# Patient Record
Sex: Female | Born: 1965 | Race: White | Hispanic: No | Smoking: Former smoker
Health system: Southern US, Community
[De-identification: ages and names within clinical notes are randomized; demographics above are authoritative.]

## PROBLEM LIST (undated history)

## (undated) DIAGNOSIS — M1990 Unspecified osteoarthritis, unspecified site: Secondary | ICD-10-CM

## (undated) DIAGNOSIS — M25511 Pain in right shoulder: Secondary | ICD-10-CM

## (undated) DIAGNOSIS — L309 Dermatitis, unspecified: Secondary | ICD-10-CM

## (undated) DIAGNOSIS — M069 Rheumatoid arthritis, unspecified: Secondary | ICD-10-CM

## (undated) DIAGNOSIS — E079 Disorder of thyroid, unspecified: Secondary | ICD-10-CM

## (undated) DIAGNOSIS — M25512 Pain in left shoulder: Secondary | ICD-10-CM

## (undated) HISTORY — DX: Unspecified osteoarthritis, unspecified site: M19.90

## (undated) HISTORY — PX: CERVICAL DISC SURGERY: SHX588

## (undated) HISTORY — DX: Dermatitis, unspecified: L30.9

---

## 1999-06-25 ENCOUNTER — Encounter: Payer: Self-pay | Admitting: Neurosurgery

## 1999-06-29 ENCOUNTER — Inpatient Hospital Stay (HOSPITAL_COMMUNITY): Admission: RE | Admit: 1999-06-29 | Discharge: 1999-06-30 | Payer: Self-pay | Admitting: Neurosurgery

## 1999-06-29 ENCOUNTER — Encounter: Payer: Self-pay | Admitting: Neurosurgery

## 1999-08-30 ENCOUNTER — Ambulatory Visit (HOSPITAL_COMMUNITY): Admission: RE | Admit: 1999-08-30 | Discharge: 1999-08-30 | Payer: Self-pay | Admitting: Neurosurgery

## 1999-08-30 ENCOUNTER — Encounter: Payer: Self-pay | Admitting: Neurosurgery

## 2001-05-23 ENCOUNTER — Ambulatory Visit (HOSPITAL_COMMUNITY): Admission: RE | Admit: 2001-05-23 | Discharge: 2001-05-23 | Payer: Self-pay | Admitting: *Deleted

## 2001-05-23 ENCOUNTER — Encounter: Payer: Self-pay | Admitting: *Deleted

## 2001-05-30 ENCOUNTER — Ambulatory Visit (HOSPITAL_COMMUNITY): Admission: RE | Admit: 2001-05-30 | Discharge: 2001-05-30 | Payer: Self-pay | Admitting: *Deleted

## 2001-05-30 ENCOUNTER — Encounter: Payer: Self-pay | Admitting: *Deleted

## 2002-12-23 ENCOUNTER — Ambulatory Visit (HOSPITAL_COMMUNITY): Admission: RE | Admit: 2002-12-23 | Discharge: 2002-12-23 | Payer: Self-pay | Admitting: Family Medicine

## 2002-12-23 ENCOUNTER — Encounter: Payer: Self-pay | Admitting: Family Medicine

## 2004-02-05 ENCOUNTER — Ambulatory Visit (HOSPITAL_COMMUNITY): Admission: RE | Admit: 2004-02-05 | Discharge: 2004-02-05 | Payer: Self-pay | Admitting: Family Medicine

## 2005-07-12 ENCOUNTER — Ambulatory Visit: Payer: Self-pay | Admitting: Internal Medicine

## 2005-07-18 ENCOUNTER — Ambulatory Visit (HOSPITAL_COMMUNITY): Admission: RE | Admit: 2005-07-18 | Discharge: 2005-07-18 | Payer: Self-pay | Admitting: Internal Medicine

## 2008-09-12 ENCOUNTER — Ambulatory Visit (HOSPITAL_COMMUNITY): Admission: RE | Admit: 2008-09-12 | Discharge: 2008-09-12 | Payer: Self-pay | Admitting: Preventative Medicine

## 2010-06-04 ENCOUNTER — Emergency Department (HOSPITAL_COMMUNITY): Admission: EM | Admit: 2010-06-04 | Discharge: 2010-06-04 | Payer: Self-pay | Admitting: Emergency Medicine

## 2011-01-20 ENCOUNTER — Emergency Department (HOSPITAL_COMMUNITY)
Admission: EM | Admit: 2011-01-20 | Discharge: 2011-01-20 | Disposition: A | Discharge status: Home / Self Care | Payer: Self-pay | Attending: Emergency Medicine | Admitting: Emergency Medicine

## 2011-01-20 ENCOUNTER — Emergency Department (HOSPITAL_COMMUNITY): Payer: Self-pay

## 2011-01-20 DIAGNOSIS — M25549 Pain in joints of unspecified hand: Secondary | ICD-10-CM | POA: Insufficient documentation

## 2011-01-20 DIAGNOSIS — M7989 Other specified soft tissue disorders: Secondary | ICD-10-CM | POA: Insufficient documentation

## 2011-01-20 DIAGNOSIS — IMO0001 Reserved for inherently not codable concepts without codable children: Secondary | ICD-10-CM | POA: Insufficient documentation

## 2011-01-20 DIAGNOSIS — R609 Edema, unspecified: Secondary | ICD-10-CM | POA: Insufficient documentation

## 2011-01-20 DIAGNOSIS — R0602 Shortness of breath: Secondary | ICD-10-CM | POA: Insufficient documentation

## 2011-01-20 DIAGNOSIS — M109 Gout, unspecified: Secondary | ICD-10-CM | POA: Insufficient documentation

## 2011-01-20 LAB — CBC
HCT: 36.4 % (ref 36.0–46.0)
Hemoglobin: 12 g/dL (ref 12.0–15.0)
MCH: 28.4 pg (ref 26.0–34.0)
MCHC: 33 g/dL (ref 30.0–36.0)
MCV: 86.3 fL (ref 78.0–100.0)
Platelets: 276 10*3/uL (ref 150–400)
RBC: 4.22 MIL/uL (ref 3.87–5.11)
RDW: 13.5 % (ref 11.5–15.5)
WBC: 9.2 10*3/uL (ref 4.0–10.5)

## 2011-01-20 LAB — COMPREHENSIVE METABOLIC PANEL
ALT: 18 U/L (ref 0–35)
AST: 21 U/L (ref 0–37)
Albumin: 3.6 g/dL (ref 3.5–5.2)
Alkaline Phosphatase: 87 U/L (ref 39–117)
BUN: 9 mg/dL (ref 6–23)
CO2: 24 mEq/L (ref 19–32)
Calcium: 8.9 mg/dL (ref 8.4–10.5)
Chloride: 106 mEq/L (ref 96–112)
Creatinine, Ser: 0.61 mg/dL (ref 0.4–1.2)
GFR calc Af Amer: >60 mL/min (ref >60)
GFR calc non Af Amer: >60 mL/min (ref >60)
Glucose, Bld: 92 mg/dL (ref 70–99)
Potassium: 3.8 mEq/L (ref 3.5–5.1)
Sodium: 139 mEq/L (ref 135–145)
Total Bilirubin: 0.6 mg/dL (ref 0.3–1.2)
Total Protein: 6.7 g/dL (ref 6.0–8.3)

## 2011-01-20 LAB — DIFFERENTIAL
Basophils Absolute: 0 10*3/uL (ref 0.0–0.1)
Basophils Relative: 0 % (ref 0–1)
Eosinophils Absolute: 0.1 10*3/uL (ref 0.0–0.7)
Eosinophils Relative: 1 % (ref 0–5)
Lymphocytes Relative: 36 % (ref 12–46)
Lymphs Abs: 3.3 10*3/uL (ref 0.7–4.0)
Monocytes Absolute: 0.4 10*3/uL (ref 0.1–1.0)
Monocytes Relative: 4 % (ref 3–12)
Neutro Abs: 5.3 10*3/uL (ref 1.7–7.7)
Neutrophils Relative %: 58 % (ref 43–77)

## 2011-01-20 LAB — BRAIN NATRIURETIC PEPTIDE: Pro B Natriuretic peptide (BNP): <30 pg/mL (ref 0.0–100.0)

## 2011-05-30 ENCOUNTER — Other Ambulatory Visit (HOSPITAL_COMMUNITY): Payer: Self-pay | Admitting: Family Medicine

## 2011-05-30 DIAGNOSIS — Z139 Encounter for screening, unspecified: Secondary | ICD-10-CM

## 2011-06-06 ENCOUNTER — Ambulatory Visit (HOSPITAL_COMMUNITY)
Admission: RE | Admit: 2011-06-06 | Discharge: 2011-06-06 | Disposition: A | Discharge status: Home / Self Care | Payer: Self-pay | Source: Ambulatory Visit | Attending: Family Medicine | Admitting: Family Medicine

## 2011-06-06 DIAGNOSIS — Z139 Encounter for screening, unspecified: Secondary | ICD-10-CM

## 2011-07-14 ENCOUNTER — Other Ambulatory Visit (HOSPITAL_COMMUNITY): Payer: Self-pay | Admitting: Family Medicine

## 2011-07-14 DIAGNOSIS — IMO0002 Reserved for concepts with insufficient information to code with codable children: Secondary | ICD-10-CM

## 2011-07-18 ENCOUNTER — Ambulatory Visit (HOSPITAL_COMMUNITY)
Admission: RE | Admit: 2011-07-18 | Discharge: 2011-07-18 | Disposition: A | Discharge status: Home / Self Care | Payer: Self-pay | Source: Ambulatory Visit | Attending: Family Medicine | Admitting: Family Medicine

## 2011-07-18 DIAGNOSIS — M542 Cervicalgia: Secondary | ICD-10-CM | POA: Insufficient documentation

## 2011-07-18 DIAGNOSIS — M5030 Other cervical disc degeneration, unspecified cervical region: Secondary | ICD-10-CM | POA: Insufficient documentation

## 2011-07-18 DIAGNOSIS — IMO0002 Reserved for concepts with insufficient information to code with codable children: Secondary | ICD-10-CM

## 2011-08-27 ENCOUNTER — Emergency Department (HOSPITAL_COMMUNITY): Payer: Self-pay

## 2011-08-27 ENCOUNTER — Emergency Department (HOSPITAL_COMMUNITY)
Admission: EM | Admit: 2011-08-27 | Discharge: 2011-08-27 | Disposition: A | Discharge status: Home / Self Care | Payer: Self-pay | Attending: Emergency Medicine | Admitting: Emergency Medicine

## 2011-08-27 DIAGNOSIS — F17200 Nicotine dependence, unspecified, uncomplicated: Secondary | ICD-10-CM | POA: Insufficient documentation

## 2011-08-27 DIAGNOSIS — N83209 Unspecified ovarian cyst, unspecified side: Secondary | ICD-10-CM | POA: Insufficient documentation

## 2011-08-27 HISTORY — DX: Disorder of thyroid, unspecified: E07.9

## 2011-08-27 LAB — DIFFERENTIAL
Basophils Absolute: 0 10*3/uL (ref 0.0–0.1)
Basophils Relative: 0 % (ref 0–1)
Eosinophils Absolute: 0.2 10*3/uL (ref 0.0–0.7)
Eosinophils Relative: 2 % (ref 0–5)
Lymphocytes Relative: 26 % (ref 12–46)
Lymphs Abs: 3.2 10*3/uL (ref 0.7–4.0)
Monocytes Absolute: 0.5 10*3/uL (ref 0.1–1.0)
Monocytes Relative: 4 % (ref 3–12)
Neutro Abs: 8.2 10*3/uL — ABNORMAL HIGH (ref 1.7–7.7)
Neutrophils Relative %: 68 % (ref 43–77)

## 2011-08-27 LAB — URINALYSIS, ROUTINE W REFLEX MICROSCOPIC
Glucose, UA: NEGATIVE mg/dL
Hgb urine dipstick: NEGATIVE
Ketones, ur: 15 mg/dL — AB
Leukocytes, UA: NEGATIVE
Nitrite: NEGATIVE
Protein, ur: NEGATIVE mg/dL
Specific Gravity, Urine: >1.03 — ABNORMAL HIGH (ref 1.005–1.030)
Urobilinogen, UA: 0.2 mg/dL (ref 0.0–1.0)
pH: 5.5 (ref 5.0–8.0)

## 2011-08-27 LAB — COMPREHENSIVE METABOLIC PANEL
ALT: 17 U/L (ref 0–35)
AST: 16 U/L (ref 0–37)
Albumin: 3.9 g/dL (ref 3.5–5.2)
Alkaline Phosphatase: 106 U/L (ref 39–117)
BUN: 7 mg/dL (ref 6–23)
CO2: 26 mEq/L (ref 19–32)
Calcium: 9.2 mg/dL (ref 8.4–10.5)
Chloride: 101 mEq/L (ref 96–112)
Creatinine, Ser: 0.6 mg/dL (ref 0.50–1.10)
GFR calc Af Amer: >60 mL/min (ref >60)
GFR calc non Af Amer: >60 mL/min (ref >60)
Glucose, Bld: 91 mg/dL (ref 70–99)
Potassium: 3.7 mEq/L (ref 3.5–5.1)
Sodium: 137 mEq/L (ref 135–145)
Total Bilirubin: 0.5 mg/dL (ref 0.3–1.2)
Total Protein: 7 g/dL (ref 6.0–8.3)

## 2011-08-27 LAB — CBC
HCT: 38.9 % (ref 36.0–46.0)
Hemoglobin: 12.6 g/dL (ref 12.0–15.0)
MCH: 28.2 pg (ref 26.0–34.0)
MCHC: 32.4 g/dL (ref 30.0–36.0)
MCV: 87 fL (ref 78.0–100.0)
Platelets: 268 10*3/uL (ref 150–400)
RBC: 4.47 MIL/uL (ref 3.87–5.11)
RDW: 14.2 % (ref 11.5–15.5)
WBC: 12.1 10*3/uL — ABNORMAL HIGH (ref 4.0–10.5)

## 2011-08-27 LAB — LIPASE, BLOOD: Lipase: 25 U/L (ref 11–59)

## 2011-08-27 LAB — PREGNANCY, URINE: Preg Test, Ur: NEGATIVE

## 2011-08-27 MED ORDER — HYDROMORPHONE HCL 1 MG/ML IJ SOLN
1.0000 mg | Freq: Once | INTRAMUSCULAR | Status: DC
  Filled 2011-08-27: qty 1

## 2011-08-27 MED ORDER — HYDROCODONE-ACETAMINOPHEN 5-325 MG PO TABS: 1.0000 | ORAL_TABLET | Freq: Four times a day (QID) | ORAL | Status: AC | PRN

## 2011-08-27 MED ORDER — ONDANSETRON HCL 4 MG/2ML IJ SOLN
4.0000 mg | Freq: Once | INTRAMUSCULAR | Status: DC
  Filled 2011-08-27: qty 2

## 2011-08-27 MED ORDER — IOHEXOL 300 MG/ML  SOLN
100.0000 mL | Freq: Once | INTRAMUSCULAR | Status: AC | PRN
  Administered 2011-08-27: 100 mL via INTRAVENOUS

## 2011-08-27 MED ORDER — SODIUM CHLORIDE 0.9 % IV SOLN: Freq: Once | INTRAVENOUS | Status: DC

## 2011-08-27 NOTE — ED Notes (Signed)
Pt refused medications.

## 2011-08-27 NOTE — ED Provider Notes (Signed)
History    Scribed for Benny Lennert, MD, the patient was seen in room APA08/APA08. This chart was scribed by Katha Cabal. This patient's care was started at 4:58 PM.     CSN: [id number] Arrival date & time: 08/27/2011  4:44 PM  Chief Complaint  Patient presents with  . Abdominal Pain    HPI  (Consider location/radiation/quality/duration/timing/severity/associated sxs/prior treatment)  HPI Amy Mccarthy is a 45 y.o. female who presents to the Emergency Department complaining of gradual worsening of intermittent LLQ and LUQ abdmonial pain for about a year and got worse yesterday at 4 PM.  Pt also c/o bilateral ankle edema.  Denies fever, vomiting, diarrhea, and dysuria. Patient denies any abdominal surgeries.  Patient states that her menstrual cycle has been irregular  and believes she is going through menopause.   LKMP: 08/13/2011     PAST MEDICAL HISTORY:  Past Medical History  Diagnosis Date  . Thyroid disease     PAST SURGICAL HISTORY:  Past Surgical History  Procedure Date  . Cervical disc surgery     FAMILY HISTORY:  History reviewed. No pertinent family history.   SOCIAL HISTORY: History   Social History  . Marital Status: Legally Separated    Spouse Name: N/A    Number of Children: N/A  . Years of Education: N/A   Social History Main Topics  . Smoking status: Current Everyday Smoker -- 1.0 packs/day  . Smokeless tobacco: None  . Alcohol Use: No  . Drug Use: No  . Sexually Active:    Other Topics Concern  . None   Social History Narrative  . None    Review of Systems  Review of Systems  Constitutional: Negative for fatigue.  HENT: Negative for congestion, sinus pressure and ear discharge.   Eyes: Negative for discharge.  Respiratory: Negative for cough.   Cardiovascular: Negative for chest pain.  Gastrointestinal: Positive for abdominal pain. Negative for diarrhea.  Genitourinary: Negative for frequency and hematuria.  Musculoskeletal:  Negative for back pain.  Skin: Negative for rash.  Neurological: Negative for seizures and headaches.  Hematological: Negative.   Psychiatric/Behavioral: Negative for hallucinations.    Allergies  Review of patient's allergies indicates no known allergies.  Home Medications   Current Outpatient Rx  Name Route Sig Dispense Refill  . LEVOTHYROXINE SODIUM 25 MCG PO TABS Oral Take 25 mcg by mouth daily.        Physical Exam    BP 114/61  Pulse 91  Temp 98.3 F (36.8 C)  Resp 20  Ht 5\' 2"  (1.575 m)  Wt 200 lb (90.719 kg)  BMI 36.58 kg/m2  SpO2 98%  LMP 08/13/2011  Physical Exam  Constitutional: She is oriented to person, place, and time. She appears well-developed. No distress.  HENT:  Head: Normocephalic and atraumatic.  Eyes: Conjunctivae and EOM are normal. No scleral icterus.  Neck: Neck supple. No thyromegaly present.  Cardiovascular: Normal rate and regular rhythm.  Exam reveals no gallop and no friction rub.   No murmur heard. Pulmonary/Chest: Effort normal and breath sounds normal. No stridor. She has no wheezes. She has no rales. She exhibits no tenderness.  Abdominal: Soft. She exhibits no distension. There is tenderness. There is no rebound.       Mild LLQ tenderness   Musculoskeletal: Normal range of motion. She exhibits edema (minimal bilateral ankle ).  Lymphadenopathy:    She has no cervical adenopathy.  Neurological: She is alert and oriented to person, place, and  time. Coordination normal.  Skin: Skin is warm. No rash noted. No erythema.  Psychiatric: She has a normal mood and affect. Her behavior is normal.    ED Course  Procedures (including critical care time)  OTHER DATA REVIEWED: Nursing notes, vital signs, and past medical records reviewed.   DIAGNOSTIC STUDIES: Oxygen Saturation is 98% on room air, normal by my interpretation.     LABS / RADIOLOGY:  Results for orders placed during the hospital encounter of 08/27/11  URINALYSIS, ROUTINE  W REFLEX MICROSCOPIC      Component Value Range   Color, Urine YELLOW  YELLOW    Appearance HAZY (*) CLEAR    Specific Gravity, Urine >1.030 (*) 1.005 - 1.030    pH 5.5  5.0 - 8.0    Glucose, UA NEGATIVE  NEGATIVE (mg/dL)   Hgb urine dipstick NEGATIVE  NEGATIVE    Bilirubin Urine SMALL (*) NEGATIVE    Ketones, ur 15 (*) NEGATIVE (mg/dL)   Protein, ur NEGATIVE  NEGATIVE (mg/dL)   Urobilinogen, UA 0.2  0.0 - 1.0 (mg/dL)   Nitrite NEGATIVE  NEGATIVE    Leukocytes, UA NEGATIVE  NEGATIVE   PREGNANCY, URINE      Component Value Range   Preg Test, Ur NEGATIVE    CBC      Component Value Range   WBC 12.1 (*) 4.0 - 10.5 (K/uL)   RBC 4.47  3.87 - 5.11 (MIL/uL)   Hemoglobin 12.6  12.0 - 15.0 (g/dL)   HCT 16.1  09.6 - 04.5 (%)   MCV 87.0  78.0 - 100.0 (fL)   MCH 28.2  26.0 - 34.0 (pg)   MCHC 32.4  30.0 - 36.0 (g/dL)   RDW 40.9  81.1 - 91.4 (%)   Platelets 268  150 - 400 (K/uL)  DIFFERENTIAL      Component Value Range   Neutrophils Relative 68  43 - 77 (%)   Neutro Abs 8.2 (*) 1.7 - 7.7 (K/uL)   Lymphocytes Relative 26  12 - 46 (%)   Lymphs Abs 3.2  0.7 - 4.0 (K/uL)   Monocytes Relative 4  3 - 12 (%)   Monocytes Absolute 0.5  0.1 - 1.0 (K/uL)   Eosinophils Relative 2  0 - 5 (%)   Eosinophils Absolute 0.2  0.0 - 0.7 (K/uL)   Basophils Relative 0  0 - 1 (%)   Basophils Absolute 0.0  0.0 - 0.1 (K/uL)  COMPREHENSIVE METABOLIC PANEL      Component Value Range   Sodium 137  135 - 145 (mEq/L)   Potassium 3.7  3.5 - 5.1 (mEq/L)   Chloride 101  96 - 112 (mEq/L)   CO2 26  19 - 32 (mEq/L)   Glucose, Bld 91  70 - 99 (mg/dL)   BUN 7  6 - 23 (mg/dL)   Creatinine, Ser 7.82  0.50 - 1.10 (mg/dL)   Calcium 9.2  8.4 - 95.6 (mg/dL)   Total Protein 7.0  6.0 - 8.3 (g/dL)   Albumin 3.9  3.5 - 5.2 (g/dL)   AST 16  0 - 37 (U/L)   ALT 17  0 - 35 (U/L)   Alkaline Phosphatase 106  39 - 117 (U/L)   Total Bilirubin 0.5  0.3 - 1.2 (mg/dL)   GFR calc non Af Amer >60  >60 (mL/min)   GFR calc Af Amer >60   >60 (mL/min)  LIPASE, BLOOD      Component Value Range   Lipase 25  11 - 59 (U/L)     Ct Abdomen Pelvis W Contrast  08/27/2011  *RADIOLOGY REPORT*  Clinical Data: Abdominal pain  CT ABDOMEN AND PELVIS WITH CONTRAST  Technique:  Multidetector CT imaging of the abdomen and pelvis was performed following the standard protocol during bolus administration of intravenous contrast.  Contrast: OMNIPAQUE IOHEXOL 300 MG/ML IV SOLN  Comparison: None  Findings: Lung bases are clear.  Liver, gallbladder, and bile ducts are normal.  Pancreas, spleen, and kidneys are normal.  3.2 cm benign cyst right lower pole.  No renal calculi.  Negative for bowel obstruction.  Appendix is normal.  Small ovarian cyst on the left measures 2.5 cm and a second cyst measures 18 mm.  Cervical cyst measures 2 x 2.8 cm.  No free fluid.  IMPRESSION: Normal appendix.  Small left ovarian cyst and a relatively large cyst of the cervix. No free fluid.  Original Report Authenticated By: Camelia Phenes, M.D.      ED COURSE / COORDINATION OF CARE: 5:10 PM  Physical exam complete.  Will order IV fluids,  labs, CT ABD with Pelvis and UA.   6:41 PM  Discussed CT results with patient.  Patient has small cyst on left ovary and large cyst on her cervix.       Orders Placed This Encounter  Procedures  . CT Abdomen Pelvis W Contrast  . Urinalysis, Routine w reflex microscopic  . Pregnancy, urine  . CBC  . Differential  . Comprehensive metabolic panel  . Lipase, blood    MDM: abd.  Ovarian cyst   IMPRESSION: Diagnoses that have been ruled out:  Diagnoses that are still under consideration:  Final diagnoses:     MEDICATIONS GIVEN IN THE E.D. Scheduled Meds:    . sodium chloride   Intravenous Once  . DISCONTD: HYDROmorphone  1 mg Intravenous Once  . DISCONTD: ondansetron  4 mg Intravenous Once   Continuous Infusions:     DISCHARGE MEDICATIONS: New Prescriptions   No medications on file    The chart was  scribed for me under my direct supervision.  I personally performed the history, physical, and medical decision making and all procedures in the evaluation of this patient.Benny Lennert, MD 08/27/11 [phone]

## 2011-08-27 NOTE — ED Notes (Signed)
Pt presents with LUQ pain that is intermittent in nature. Pt denies n/v/d.

## 2011-08-27 NOTE — ED Notes (Signed)
Pt reports LLQ & LUQ pain since yesterday.  Pt denies any GI/GU problems.  nad noted

## 2011-09-06 ENCOUNTER — Emergency Department (HOSPITAL_COMMUNITY)
Admission: EM | Admit: 2011-09-06 | Discharge: 2011-09-06 | Disposition: A | Discharge status: Home / Self Care | Payer: Self-pay | Attending: Emergency Medicine | Admitting: Emergency Medicine

## 2011-09-06 ENCOUNTER — Encounter (HOSPITAL_COMMUNITY): Payer: Self-pay | Admitting: *Deleted

## 2011-09-06 ENCOUNTER — Emergency Department (HOSPITAL_COMMUNITY): Payer: Self-pay

## 2011-09-06 DIAGNOSIS — Z87828 Personal history of other (healed) physical injury and trauma: Secondary | ICD-10-CM | POA: Insufficient documentation

## 2011-09-06 DIAGNOSIS — M25519 Pain in unspecified shoulder: Secondary | ICD-10-CM | POA: Insufficient documentation

## 2011-09-06 DIAGNOSIS — Z79899 Other long term (current) drug therapy: Secondary | ICD-10-CM | POA: Insufficient documentation

## 2011-09-06 DIAGNOSIS — F17200 Nicotine dependence, unspecified, uncomplicated: Secondary | ICD-10-CM | POA: Insufficient documentation

## 2011-09-06 DIAGNOSIS — M25512 Pain in left shoulder: Secondary | ICD-10-CM

## 2011-09-06 HISTORY — DX: Pain in right shoulder: M25.511

## 2011-09-06 HISTORY — DX: Pain in left shoulder: M25.512

## 2011-09-06 MED ORDER — HYDROCODONE-ACETAMINOPHEN 5-325 MG PO TABS: 1.0000 | ORAL_TABLET | Freq: Four times a day (QID) | ORAL | Status: AC | PRN

## 2011-09-06 NOTE — ED Provider Notes (Signed)
History     CSN: [id number] Arrival date & time: 09/06/2011  4:39 PM  Chief Complaint  Patient presents with  . Shoulder Pain    (Consider location/radiation/quality/duration/timing/severity/associated sxs/prior treatment) HPI Comments: No known injury.  She has known R rotator cuff injury and R carpal tunnel syndrome.  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  Patient is a 45 y.o. female presenting with shoulder pain. The history is provided by the patient. No language interpreter was used.  Shoulder Pain This is a new problem. The current episode started yesterday. The problem occurs constantly. The problem has been unchanged. Associated symptoms include arthralgias. Exacerbated by: movement and palpation. She has tried nothing for the symptoms.    Past Medical History  Diagnosis Date  . Thyroid disease   . Right shoulder pain   . Shoulder pain, left     Past Surgical History  Procedure Date  . Cervical disc surgery     History reviewed. No pertinent family history.  History  Substance Use Topics  . Smoking status: Current Everyday Smoker -- 1.0 packs/day  . Smokeless tobacco: Not on file  . Alcohol Use: No    OB History    Grav Para Term Preterm Abortions TAB SAB Ect Mult Living                  Review of Systems  Musculoskeletal: Positive for arthralgias.  All other systems reviewed and are negative.    Allergies  Review of patient's allergies indicates no known allergies.  Home Medications   Current Outpatient Rx  Name Route Sig Dispense Refill  . HYDROCODONE-ACETAMINOPHEN 5-325 MG PO TABS Oral Take 1 tablet by mouth every 6 (six) hours as needed for pain. 20 tablet 0  . LEVOTHYROXINE SODIUM 25 MCG PO TABS Oral Take 25 mcg by mouth daily.      Marland Kitchen MENTHOL (TOPICAL ANALGESIC) 5 % EX PADS Apply externally Apply 1 application topically as needed. For pain     . SALINE NASAL MIST NA Nasal Place 2 sprays into the nose as needed.  Sinus congestion       BP 149/82  Pulse 95  Temp(Src) 98.2 F (36.8 C) (Oral)  Resp 20  Ht 5\' 2"  (1.575 m)  Wt 200 lb (90.719 kg)  BMI 36.58 kg/m2  SpO2 100%  LMP 08/11/2011  Physical Exam  Nursing note and vitals reviewed. Constitutional: She is oriented to person, place, and time. She appears well-developed and well-nourished. No distress.  HENT:  Head: Normocephalic and atraumatic.  Eyes: EOM are normal.  Neck: Normal range of motion.  Cardiovascular: Normal rate, regular rhythm and normal heart sounds.   Pulmonary/Chest: Effort normal and breath sounds normal.  Abdominal: Soft. She exhibits no distension. There is no tenderness.  Musculoskeletal:       Left shoulder: She exhibits decreased range of motion, tenderness, bony tenderness and pain. She exhibits no swelling, no effusion, no crepitus, no deformity, no laceration, no spasm, normal pulse and normal strength.       Arms: Neurological: She is alert and oriented to person, place, and time.  Skin: Skin is warm and dry.  Psychiatric: She has a normal mood and affect. Judgment normal.    ED Course  Procedures (including critical care time)  Labs Reviewed - No data to display No results found.   No diagnosis found.    MDM          Duke Salvia  [place], [place] 09/06/11 1845

## 2011-09-06 NOTE — ED Notes (Signed)
Pt c/o pain to her left shoulder. Pt states she can not raise her arm over her head.

## 2011-09-07 NOTE — ED Provider Notes (Signed)
Medical screening examination/treatment/procedure(s) were performed by non-physician practitioner and as supervising physician I was immediately available for consultation/collaboration. Devoria Albe, MD, Armando Gang   Ward Givens, MD 09/07/11 [phone]

## 2011-09-13 ENCOUNTER — Other Ambulatory Visit: Payer: Self-pay | Admitting: Obstetrics & Gynecology

## 2011-09-13 ENCOUNTER — Other Ambulatory Visit (HOSPITAL_COMMUNITY)
Admission: RE | Admit: 2011-09-13 | Discharge: 2011-09-13 | Disposition: A | Discharge status: Home / Self Care | Payer: Self-pay | Source: Ambulatory Visit | Attending: Obstetrics & Gynecology | Admitting: Obstetrics & Gynecology

## 2011-09-13 DIAGNOSIS — Z01419 Encounter for gynecological examination (general) (routine) without abnormal findings: Secondary | ICD-10-CM | POA: Insufficient documentation

## 2012-02-02 ENCOUNTER — Other Ambulatory Visit (HOSPITAL_COMMUNITY): Payer: Self-pay | Admitting: Otolaryngology

## 2012-02-02 DIAGNOSIS — R0982 Postnasal drip: Secondary | ICD-10-CM

## 2012-02-06 ENCOUNTER — Ambulatory Visit (HOSPITAL_COMMUNITY)
Admission: RE | Admit: 2012-02-06 | Discharge: 2012-02-06 | Disposition: A | Discharge status: Home / Self Care | Payer: Self-pay | Source: Ambulatory Visit | Attending: Otolaryngology | Admitting: Otolaryngology

## 2012-02-06 DIAGNOSIS — R51 Headache: Secondary | ICD-10-CM | POA: Insufficient documentation

## 2012-02-06 DIAGNOSIS — R0982 Postnasal drip: Secondary | ICD-10-CM

## 2012-02-06 DIAGNOSIS — J329 Chronic sinusitis, unspecified: Secondary | ICD-10-CM | POA: Insufficient documentation

## 2012-07-05 DIAGNOSIS — M069 Rheumatoid arthritis, unspecified: Secondary | ICD-10-CM

## 2012-07-05 HISTORY — DX: Rheumatoid arthritis, unspecified: M06.9

## 2012-07-11 ENCOUNTER — Other Ambulatory Visit (HOSPITAL_COMMUNITY): Payer: Self-pay | Admitting: Rheumatology

## 2012-07-12 ENCOUNTER — Other Ambulatory Visit (HOSPITAL_COMMUNITY): Payer: Self-pay | Admitting: Rheumatology

## 2012-07-12 ENCOUNTER — Ambulatory Visit (HOSPITAL_COMMUNITY)
Admission: RE | Admit: 2012-07-12 | Discharge: 2012-07-12 | Disposition: A | Discharge status: Home / Self Care | Payer: Self-pay | Source: Ambulatory Visit | Attending: Rheumatology | Admitting: Rheumatology

## 2012-07-12 ENCOUNTER — Encounter (HOSPITAL_COMMUNITY): Payer: Self-pay

## 2012-07-12 ENCOUNTER — Ambulatory Visit (HOSPITAL_COMMUNITY): Payer: Self-pay

## 2012-07-12 DIAGNOSIS — M2550 Pain in unspecified joint: Secondary | ICD-10-CM

## 2012-07-12 DIAGNOSIS — M25569 Pain in unspecified knee: Secondary | ICD-10-CM | POA: Insufficient documentation

## 2012-07-12 DIAGNOSIS — M25579 Pain in unspecified ankle and joints of unspecified foot: Secondary | ICD-10-CM | POA: Insufficient documentation

## 2012-07-12 DIAGNOSIS — M25559 Pain in unspecified hip: Secondary | ICD-10-CM | POA: Insufficient documentation

## 2012-07-12 DIAGNOSIS — R079 Chest pain, unspecified: Secondary | ICD-10-CM | POA: Insufficient documentation

## 2012-07-12 DIAGNOSIS — M25549 Pain in joints of unspecified hand: Secondary | ICD-10-CM | POA: Insufficient documentation

## 2012-07-12 DIAGNOSIS — M25519 Pain in unspecified shoulder: Secondary | ICD-10-CM | POA: Insufficient documentation

## 2012-07-12 HISTORY — DX: Rheumatoid arthritis, unspecified: M06.9

## 2012-11-21 IMAGING — CR DG SHOULDER 2+V*L*
3 series · 3 of 3 positions shown · non-contrast
Comparison: None.

CLINICAL DATA: Pain, no known injury.

LEFT SHOULDER - 2+ VIEW

[view not recorded (1 of 3)]
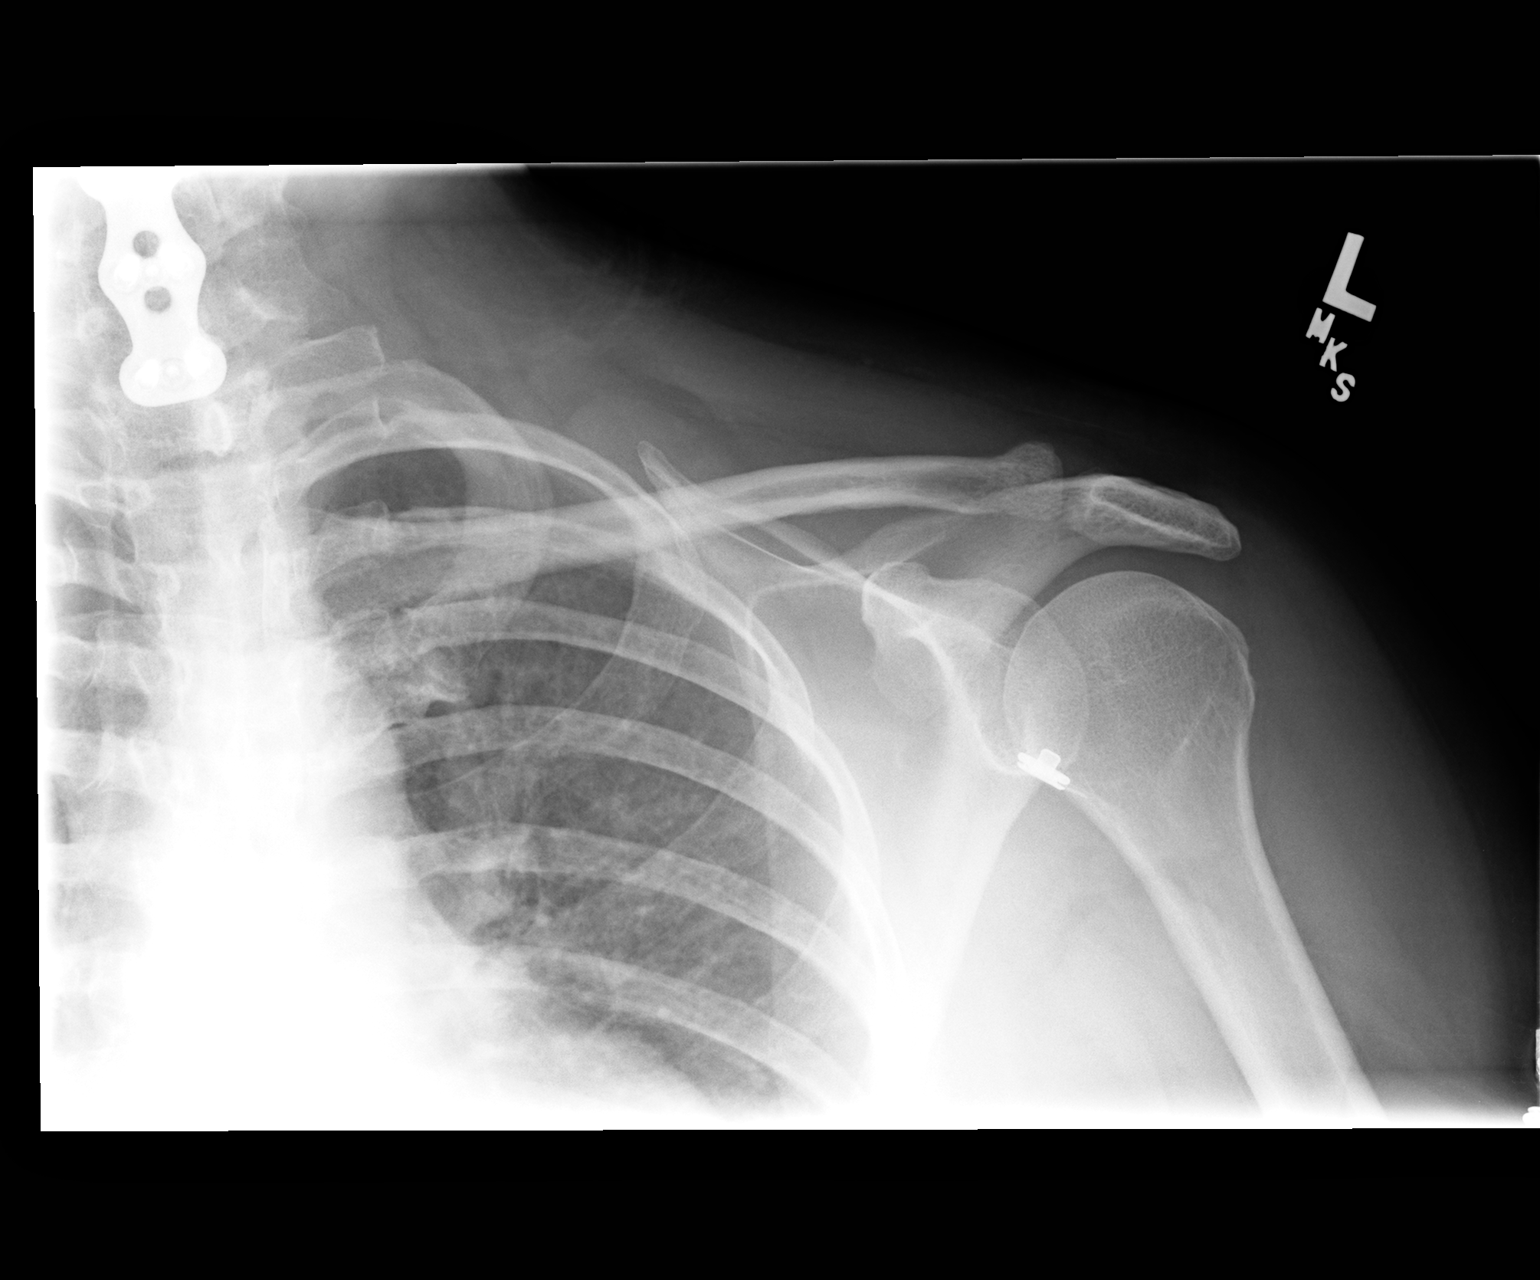

[view not recorded (2 of 3)]
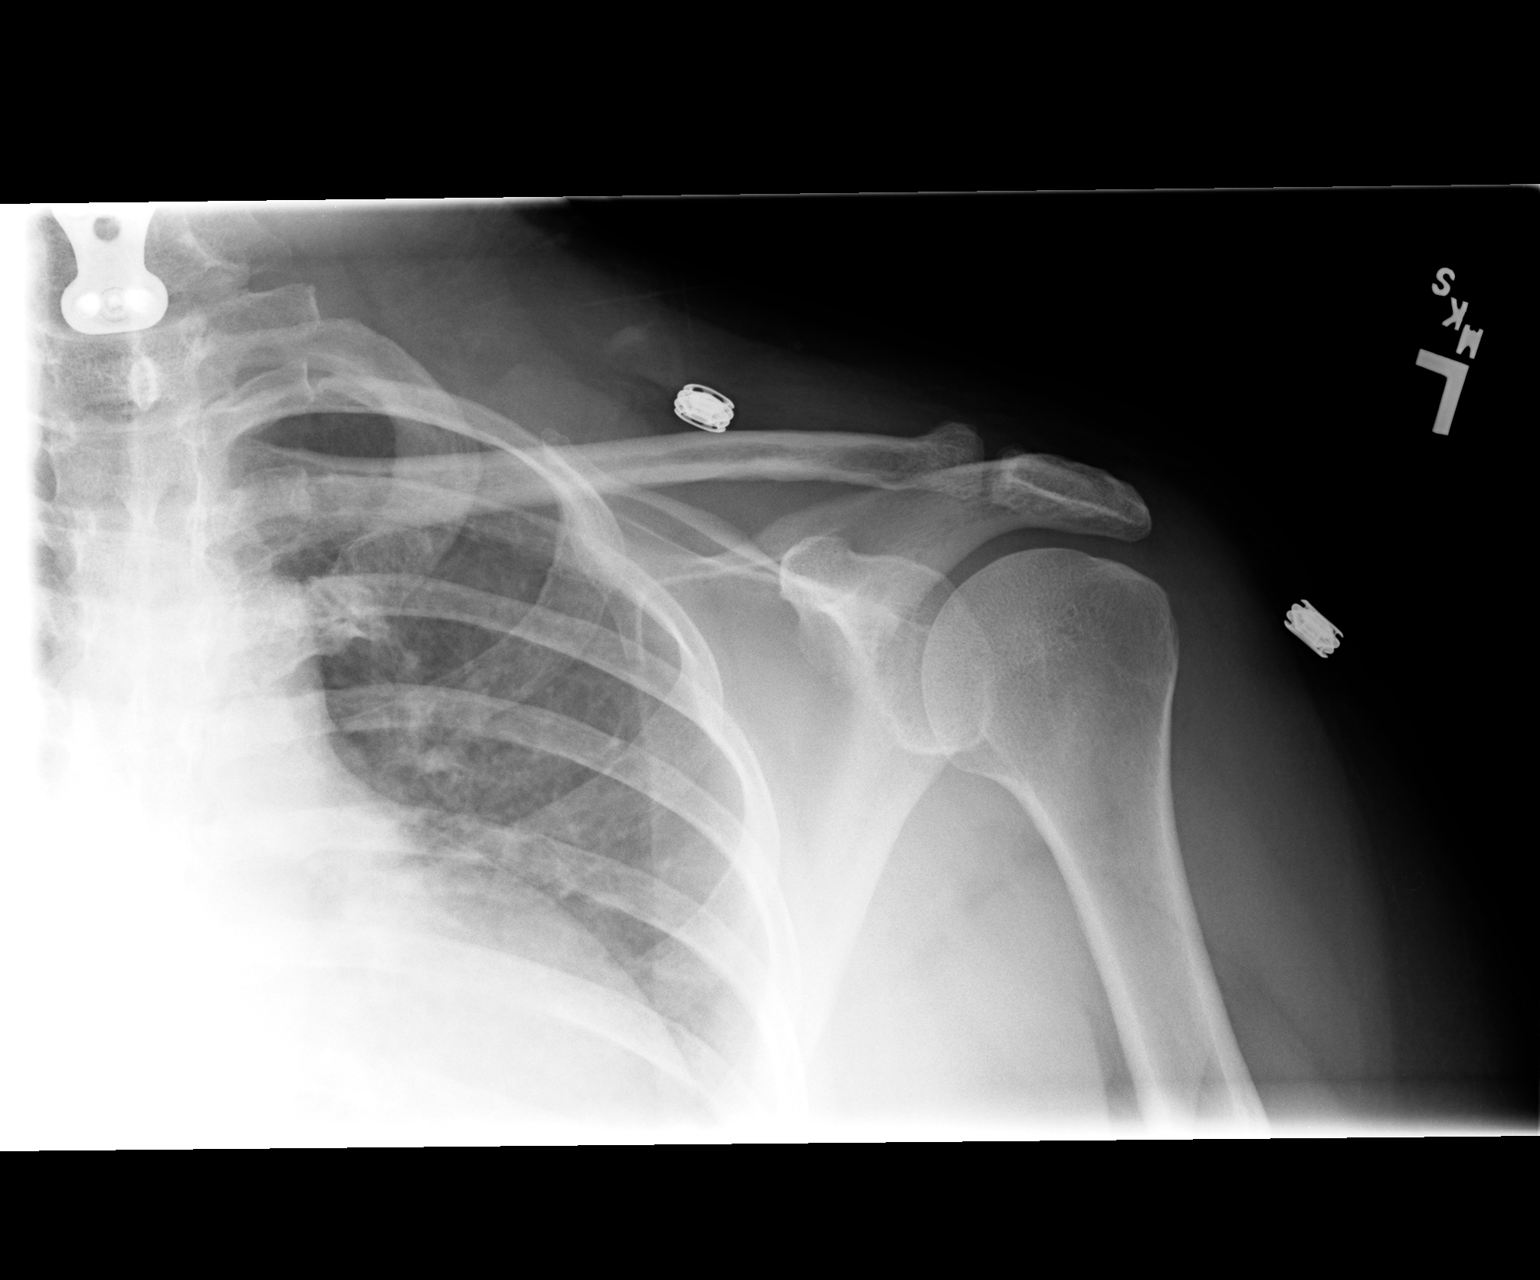

[view not recorded (3 of 3)]
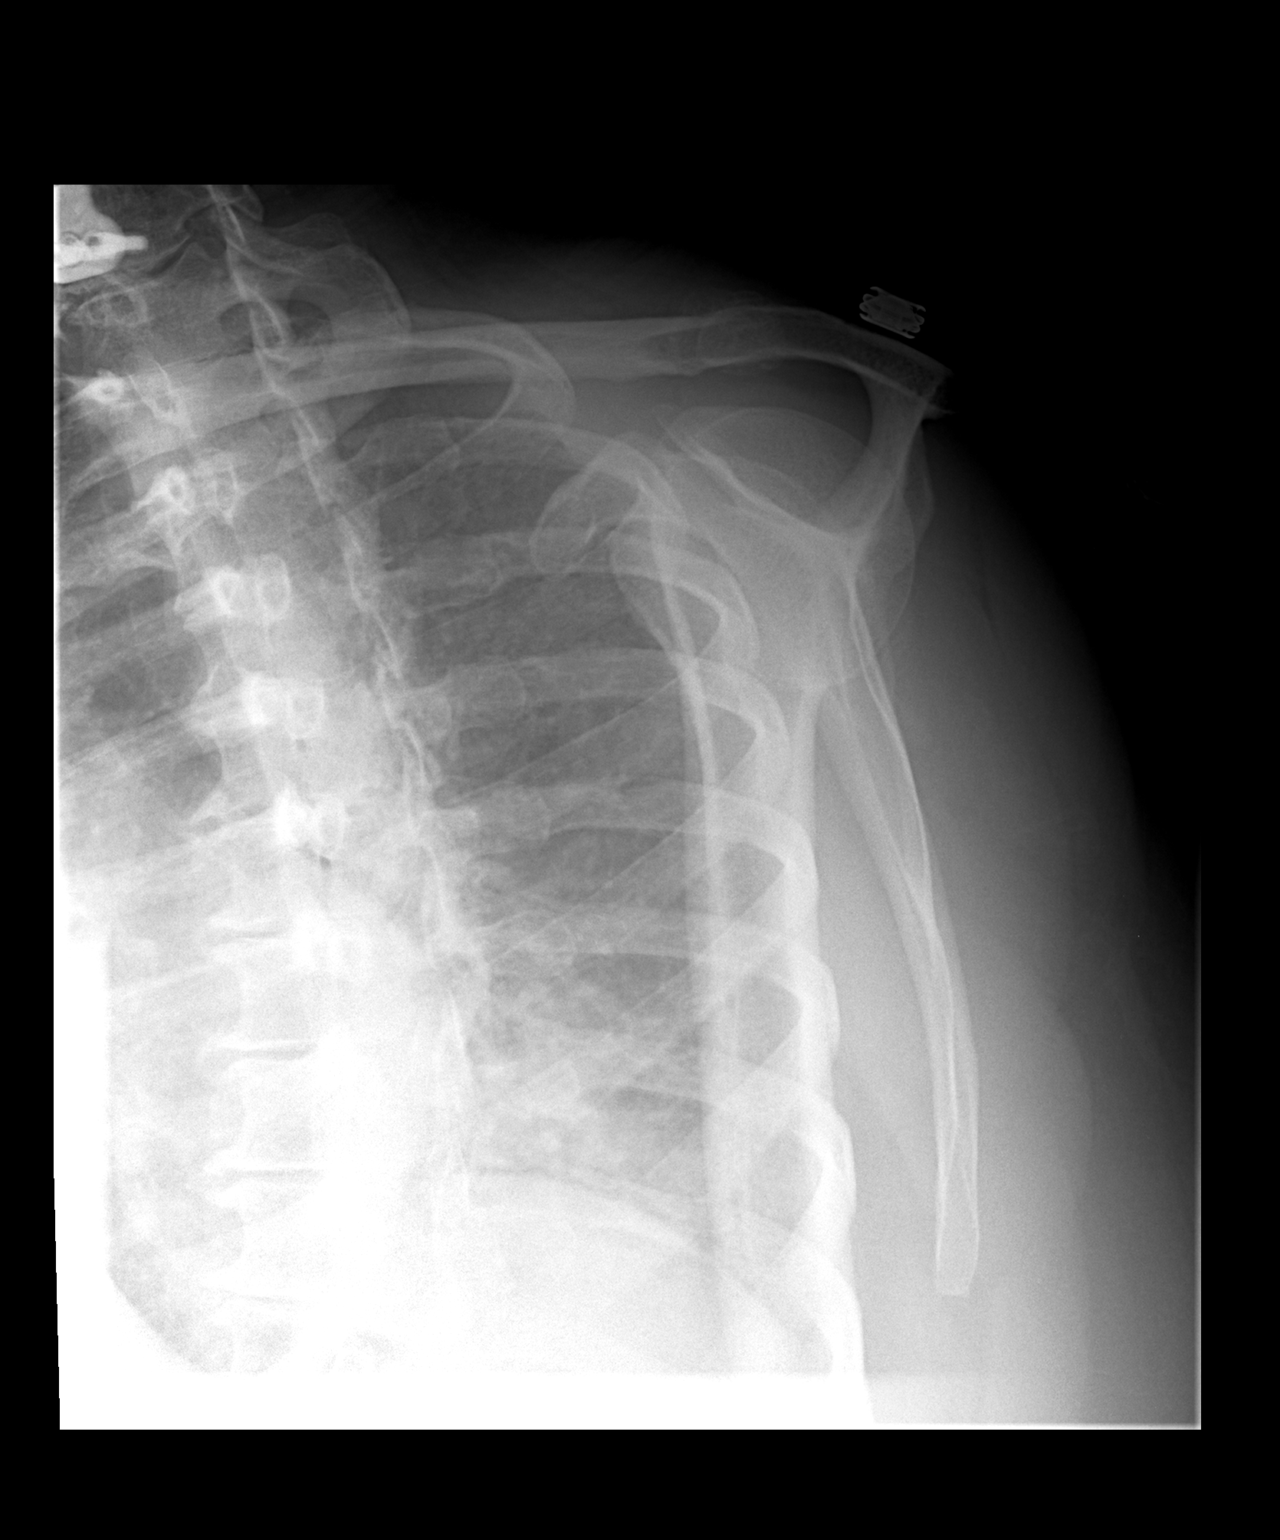

[3 of 3 positions shown; findings below may reference images not displayed]

FINDINGS: There is no visible fracture or dislocation.
Acromioclavicular joint is intact. No abnormal calcifications or
osseous destructive lesions can be seen.  Visualized ribs and
scapula are unremarkable.
IMPRESSION: Negative.

## 2013-01-23 ENCOUNTER — Other Ambulatory Visit (HOSPITAL_COMMUNITY): Payer: Self-pay | Admitting: Otolaryngology

## 2013-01-23 DIAGNOSIS — J019 Acute sinusitis, unspecified: Secondary | ICD-10-CM

## 2013-01-28 ENCOUNTER — Ambulatory Visit (HOSPITAL_COMMUNITY)
Admission: RE | Admit: 2013-01-28 | Discharge: 2013-01-28 | Disposition: A | Discharge status: Home / Self Care | Payer: Self-pay | Source: Ambulatory Visit | Attending: Otolaryngology | Admitting: Otolaryngology

## 2013-01-28 DIAGNOSIS — J019 Acute sinusitis, unspecified: Secondary | ICD-10-CM | POA: Insufficient documentation

## 2013-01-28 DIAGNOSIS — R51 Headache: Secondary | ICD-10-CM | POA: Insufficient documentation

## 2013-08-29 ENCOUNTER — Other Ambulatory Visit (HOSPITAL_COMMUNITY): Payer: Self-pay | Admitting: Internal Medicine

## 2013-08-29 DIAGNOSIS — Z1231 Encounter for screening mammogram for malignant neoplasm of breast: Secondary | ICD-10-CM

## 2013-09-02 ENCOUNTER — Ambulatory Visit (HOSPITAL_COMMUNITY)
Admission: RE | Admit: 2013-09-02 | Discharge: 2013-09-02 | Disposition: A | Discharge status: Home / Self Care | Payer: Self-pay | Source: Ambulatory Visit | Attending: Internal Medicine | Admitting: Internal Medicine

## 2013-09-02 DIAGNOSIS — Z1231 Encounter for screening mammogram for malignant neoplasm of breast: Secondary | ICD-10-CM

## 2014-08-07 ENCOUNTER — Other Ambulatory Visit (HOSPITAL_COMMUNITY): Payer: Self-pay | Admitting: General Surgery

## 2014-08-07 DIAGNOSIS — R109 Unspecified abdominal pain: Secondary | ICD-10-CM

## 2014-08-18 ENCOUNTER — Ambulatory Visit (HOSPITAL_COMMUNITY)
Admission: RE | Admit: 2014-08-18 | Discharge: 2014-08-18 | Disposition: A | Discharge status: Home / Self Care | Payer: Self-pay | Source: Ambulatory Visit | Attending: General Surgery | Admitting: General Surgery

## 2014-08-18 DIAGNOSIS — J90 Pleural effusion, not elsewhere classified: Secondary | ICD-10-CM | POA: Insufficient documentation

## 2014-08-18 DIAGNOSIS — R109 Unspecified abdominal pain: Secondary | ICD-10-CM | POA: Insufficient documentation

## 2014-09-24 ENCOUNTER — Ambulatory Visit: Payer: Self-pay | Admitting: Podiatry

## 2014-09-25 ENCOUNTER — Other Ambulatory Visit (HOSPITAL_COMMUNITY): Payer: Self-pay | Admitting: Internal Medicine

## 2014-09-25 DIAGNOSIS — Z1231 Encounter for screening mammogram for malignant neoplasm of breast: Secondary | ICD-10-CM

## 2014-10-13 ENCOUNTER — Ambulatory Visit (HOSPITAL_COMMUNITY)
Admission: RE | Admit: 2014-10-13 | Discharge: 2014-10-13 | Disposition: A | Discharge status: Home / Self Care | Payer: Self-pay | Source: Ambulatory Visit | Attending: Internal Medicine | Admitting: Internal Medicine

## 2014-10-13 DIAGNOSIS — Z1231 Encounter for screening mammogram for malignant neoplasm of breast: Secondary | ICD-10-CM

## 2014-11-13 ENCOUNTER — Emergency Department (HOSPITAL_COMMUNITY)
Admission: EM | Admit: 2014-11-13 | Discharge: 2014-11-14 | Disposition: A | Discharge status: Home / Self Care | Payer: Self-pay | Attending: Emergency Medicine | Admitting: Emergency Medicine

## 2014-11-13 ENCOUNTER — Encounter (HOSPITAL_COMMUNITY): Payer: Self-pay

## 2014-11-13 DIAGNOSIS — J0120 Acute ethmoidal sinusitis, unspecified: Secondary | ICD-10-CM | POA: Insufficient documentation

## 2014-11-13 DIAGNOSIS — J322 Chronic ethmoidal sinusitis: Secondary | ICD-10-CM

## 2014-11-13 DIAGNOSIS — Z8739 Personal history of other diseases of the musculoskeletal system and connective tissue: Secondary | ICD-10-CM | POA: Insufficient documentation

## 2014-11-13 DIAGNOSIS — R51 Headache: Secondary | ICD-10-CM | POA: Insufficient documentation

## 2014-11-13 DIAGNOSIS — R519 Headache, unspecified: Secondary | ICD-10-CM

## 2014-11-13 DIAGNOSIS — Z720 Tobacco use: Secondary | ICD-10-CM | POA: Insufficient documentation

## 2014-11-13 DIAGNOSIS — E079 Disorder of thyroid, unspecified: Secondary | ICD-10-CM | POA: Insufficient documentation

## 2014-11-13 NOTE — ED Notes (Signed)
Patient states that she is having a lot of head pressure. Had a spell a couple of weeks ago and vomiting and felt like my eyes were going to pop out of my head. My doctor for my rheumatoid arthritis and she put me on steroids. Feel like I am not quite with it. Eye doctor told me to go to the regular doctor, they did not have any appointments. I am hurting in the left side of my neck, some in my left arm, and above left breast. Sometimes it feels like my scalp is tightening up.

## 2014-11-14 ENCOUNTER — Emergency Department (HOSPITAL_COMMUNITY): Payer: Self-pay

## 2014-11-14 LAB — BASIC METABOLIC PANEL
Anion gap: 14 (ref 5–15)
BUN: 21 mg/dL (ref 6–23)
CO2: 27 mEq/L (ref 19–32)
Calcium: 9.6 mg/dL (ref 8.4–10.5)
Chloride: 101 mEq/L (ref 96–112)
Creatinine, Ser: 0.71 mg/dL (ref 0.50–1.10)
GFR calc Af Amer: >90 mL/min (ref >90)
GFR calc non Af Amer: >90 mL/min (ref >90)
Glucose, Bld: 151 mg/dL — ABNORMAL HIGH (ref 70–99)
Potassium: 4.5 mEq/L (ref 3.7–5.3)
Sodium: 142 mEq/L (ref 137–147)

## 2014-11-14 MED ORDER — HYDROCODONE-ACETAMINOPHEN 5-325 MG PO TABS
1.0000 | ORAL_TABLET | Freq: Once | ORAL | Status: DC
  Filled 2014-11-14: qty 1

## 2014-11-14 MED ORDER — AMOXICILLIN-POT CLAVULANATE 875-125 MG PO TABS: 1.0000 | ORAL_TABLET | Freq: Two times a day (BID) | ORAL | Status: DC

## 2014-11-14 MED ORDER — AMOXICILLIN-POT CLAVULANATE 875-125 MG PO TABS
1.0000 | ORAL_TABLET | Freq: Once | ORAL | Status: AC
  Administered 2014-11-14: 1 via ORAL
  Filled 2014-11-14: qty 1

## 2014-11-14 NOTE — ED Provider Notes (Signed)
CSN: [id number]     Arrival date & time 11/13/14  2136 History   First MD Initiated Contact with Patient 11/13/14 2320     Chief Complaint  Patient presents with  . Headache     (Consider location/radiation/quality/duration/timing/severity/associated sxs/prior Treatment) The history is provided by the patient.   Amy Mccarthy is a 48 y.o. female with a history of rheumatoid arthritis, thyroid disease and fibromyalgia presenting with a one month history of unrelenting headache,  Described as a pressure sensation in her left parietal area along with pain and pressure behind her eyes.  She reports having flares of her rheumatoid arthritis in the past affecting her eyes but has saw her ophthalmologist this past week (Dr. Willey Blade) who per her report did not feel her eye pain is related to a rheumatoid flare. (she had several episodes of vomiting about 2 weeks ago when her headache was worse actually causing bilateral subconjunctival hemorrhages which are now clearing (and which partially prompted her ophthalmologist visit).  She has also been seen by Dr Wendelyn Breslow during this past month and has been placed on a long prednisone taper, currently taking 20 mg daily. This does not help her headache.  She denies photophobia, phonophobia, dizziness or focal weakness.  She also describes a pressure and pulling sensation along the left side of her neck to her left arm and shoulder area which is worsened with head and neck flexion and extension.  Denies fevers.  She does have a history of sinusitis but denies recent uri/nasal congestion sx.  She has found no alleviators.    Past Medical History  Diagnosis Date  . Thyroid disease   . Right shoulder pain   . Shoulder pain, left   . Rheumatoid arthritis(714.0) 8/13   Past Surgical History  Procedure Laterality Date  . Cervical disc surgery     No family history on file. History  Substance Use Topics  . Smoking status: Current Every Day Smoker -- 1.00  packs/day  . Smokeless tobacco: Not on file  . Alcohol Use: No   OB History    No data available     Review of Systems  Constitutional: Negative for fever and chills.  HENT: Negative for congestion and sore throat.   Eyes: Negative.  Negative for visual disturbance.  Respiratory: Negative for chest tightness and shortness of breath.   Cardiovascular: Negative for chest pain.  Gastrointestinal: Negative for nausea and abdominal pain.  Genitourinary: Negative.   Musculoskeletal: Positive for neck pain. Negative for joint swelling and arthralgias.  Skin: Negative.  Negative for rash and wound.  Neurological: Positive for headaches. Negative for dizziness, facial asymmetry, weakness, light-headedness and numbness.  Psychiatric/Behavioral: Negative.       Allergies  Review of patient's allergies indicates no known allergies.  Home Medications   Prior to Admission medications   Medication Sig Start Date End Date Taking? Authorizing Provider  predniSONE (DELTASONE) 5 MG tablet Take 5 mg by mouth daily with breakfast. Tapering dose 30 mg to 2.5 mg over one month, currently taking 20 mg qd   Yes Historical Provider, MD  levothyroxine (SYNTHROID, LEVOTHROID) 25 MCG tablet Take 25 mcg by mouth daily.      Historical Provider, MD  Menthol, Topical Analgesic, (PAIN RELIEVING PATCH) 5 % PADS Apply 1 application topically as needed. For pain     Historical Provider, MD  SALINE NASAL MIST NA Place 2 sprays into the nose as needed. Sinus congestion     Historical Provider, MD  BP 130/58 mmHg  Pulse 95  Temp(Src) 98.7 F (37.1 C) (Oral)  Resp 16  Ht 5\' 2"  (1.575 m)  Wt 182 lb 6 oz (82.725 kg)  BMI 33.35 kg/m2  SpO2 98% Physical Exam  Constitutional: She appears well-developed and well-nourished.  HENT:  Head: Normocephalic and atraumatic.  Right Ear: Tympanic membrane and ear canal normal.  Left Ear: Tympanic membrane and ear canal normal.  Nose: Nose normal. Right sinus exhibits no  maxillary sinus tenderness and no frontal sinus tenderness. Left sinus exhibits no maxillary sinus tenderness and no frontal sinus tenderness.  Eyes: EOM are normal. Pupils are equal, round, and reactive to light. Right conjunctiva has a hemorrhage. Left conjunctiva has a hemorrhage.  Healing lateral bilateral subconjunctival hemorrhages.  Neck: Normal range of motion and full passive range of motion without pain. No spinous process tenderness and no muscular tenderness present.  Cardiovascular: Normal rate, regular rhythm, normal heart sounds and intact distal pulses.   Pulmonary/Chest: Effort normal and breath sounds normal. She has no wheezes.  Abdominal: Soft. Bowel sounds are normal. There is no tenderness.  Musculoskeletal: Normal range of motion.  Neurological: She is alert. She has normal strength. No cranial nerve deficit or sensory deficit. Coordination and gait normal.  Skin: Skin is warm and dry.  Psychiatric: She has a normal mood and affect.  Nursing note and vitals reviewed.   ED Course  Procedures (including critical care time) Labs Review Labs Reviewed  BASIC METABOLIC PANEL - Abnormal; Notable for the following:    Glucose, Bld 151 (*)    All other components within normal limits    Imaging Review Ct Head Wo Contrast  11/14/2014   CLINICAL DATA:  Headache. Feeling of increased pressure in eyes. Eyes feel week.  EXAM: CT HEAD WITHOUT CONTRAST  TECHNIQUE: Contiguous axial images were obtained from the base of the skull through the vertex without intravenous contrast.  COMPARISON:  09/12/2008  FINDINGS: Ventricles and sulci appear symmetrical. No mass effect or midline shift. No abnormal extra-axial fluid collections. Gray-white matter junctions are distinct. Basal cisterns are not effaced. No evidence of acute intracranial hemorrhage. No depressed skull fractures. Opacification of multiple bilateral ethmoid air cells. Retention cysts in the sphenoid sinus. Mastoid air cells  are not opacified.  IMPRESSION: No acute intracranial abnormalities. Inflammatory changes suggested in the paranasal sinuses.   Electronically Signed   By: 11/12/2008 M.D.   On: 11/14/2014 00:59     EKG Interpretation None      MDM   Final diagnoses:  Headache    Ct scan reviewed. With h/o prolonged headache, ethmoid opacification on Ct - will cover for sinusitis.  augmentin prescribed.  First dose given here.  She was encouraged f/u with pcp for a recheck if sx persist.  Discussed with Dr 14/10/2014 prior to dc home.    Estell Harpin, PA-C 11/14/14 0115  14/11/15, MD 11/14/14 [phone]

## 2014-11-14 NOTE — Discharge Instructions (Signed)

## 2015-07-20 ENCOUNTER — Other Ambulatory Visit (HOSPITAL_COMMUNITY): Payer: Self-pay | Admitting: Internal Medicine

## 2015-07-20 DIAGNOSIS — E049 Nontoxic goiter, unspecified: Secondary | ICD-10-CM

## 2015-07-20 DIAGNOSIS — R1310 Dysphagia, unspecified: Secondary | ICD-10-CM

## 2015-07-23 ENCOUNTER — Ambulatory Visit (HOSPITAL_COMMUNITY): Admission: RE | Admit: 2015-07-23 | Payer: Self-pay | Source: Ambulatory Visit

## 2016-03-01 ENCOUNTER — Other Ambulatory Visit: Payer: Self-pay | Admitting: Obstetrics and Gynecology

## 2016-03-01 DIAGNOSIS — N644 Mastodynia: Secondary | ICD-10-CM

## 2016-03-04 ENCOUNTER — Other Ambulatory Visit: Payer: Self-pay

## 2016-03-04 ENCOUNTER — Inpatient Hospital Stay: Admission: RE | Admit: 2016-03-04 | Payer: Self-pay | Source: Ambulatory Visit

## 2016-03-14 ENCOUNTER — Ambulatory Visit
Admission: RE | Admit: 2016-03-14 | Discharge: 2016-03-14 | Disposition: A | Discharge status: Home / Self Care | Payer: BLUE CROSS/BLUE SHIELD | Source: Ambulatory Visit | Attending: Obstetrics and Gynecology | Admitting: Obstetrics and Gynecology

## 2016-03-14 DIAGNOSIS — N644 Mastodynia: Secondary | ICD-10-CM

## 2016-09-09 ENCOUNTER — Ambulatory Visit (INDEPENDENT_AMBULATORY_CARE_PROVIDER_SITE_OTHER): Payer: BLUE CROSS/BLUE SHIELD | Admitting: Cardiovascular Disease

## 2016-09-09 ENCOUNTER — Encounter: Payer: Self-pay | Admitting: Cardiovascular Disease

## 2016-09-09 ENCOUNTER — Encounter: Payer: Self-pay | Admitting: *Deleted

## 2016-09-09 VITALS — BP 101/68 | HR 81 | Ht 62.0 in | Wt 216.0 lb

## 2016-09-09 DIAGNOSIS — R0600 Dyspnea, unspecified: Secondary | ICD-10-CM

## 2016-09-09 DIAGNOSIS — R0789 Other chest pain: Secondary | ICD-10-CM

## 2016-09-09 DIAGNOSIS — R600 Localized edema: Secondary | ICD-10-CM | POA: Diagnosis not present

## 2016-09-09 DIAGNOSIS — R0609 Other forms of dyspnea: Secondary | ICD-10-CM | POA: Diagnosis not present

## 2016-09-09 NOTE — Patient Instructions (Signed)
Medication Instructions:  Continue all current medications.  Labwork: none  Testing/Procedures:  Your physician has requested that you have an echocardiogram. Echocardiography is a painless test that uses sound waves to create images of your heart. It provides your doctor with information about the size and shape of your heart and how well your heart's chambers and valves are working. This procedure takes approximately one hour. There are no restrictions for this procedure.  Your physician has requested that you have a lexiscan myoview. For further information please visit [email or web address] Please follow instruction sheet, as given.  Office will contact with results via phone or letter.    Follow-Up: 6-7 weeks   Any Other Special Instructions Will Be Listed Below (If Applicable).  If you need a refill on your cardiac medications before your next appointment, please call your pharmacy. \

## 2016-09-09 NOTE — Progress Notes (Signed)
CARDIOLOGY CONSULT NOTE  Patient ID: Amy Mccarthy MRN: [id number] DOB/AGE: 1966/02/10 50 y.o.  Admit date: (Not on file) Primary Physician: Lenise Herald, PA-C Referring Physician:   Reason for Consultation:  Chest pain  HPI: 47 year old woman with rheumatoid arthritis, hypothyroidism, and obesity referred for the evaluation of chest pain.  ECG today shows NSR with late R wave transition.  She is a Interior and spatial designer. She has a history of tobacco abuse and has smoked one pack daily for the past 35 years. She said she has put on at least 30 pounds in the past 2 years. She has noticed more exertional dyspnea which occurs even when talking. She has upper right and left-sided chest pain radiating into the left axilla and back. She also complains of chest wall tenderness even after coughing. She thinks her total cholesterol was 204. She said she has never been tested for COPD.  She has also had bilateral leg swelling. She has exertional dyspnea when walking her dogs.    No Known Allergies  Current Outpatient Prescriptions  Medication Sig Dispense Refill  . fluticasone (FLONASE) 50 MCG/ACT nasal spray Place 2 sprays into both nostrils daily as needed.  10  . levothyroxine (SYNTHROID, LEVOTHROID) 50 MCG tablet Take 1 tablet by mouth daily.  3  . tiZANidine (ZANAFLEX) 4 MG tablet Take 1 tablet by mouth at bedtime as needed.  0   No current facility-administered medications for this visit.     Past Medical History:  Diagnosis Date  . Rheumatoid arthritis(714.0) 8/13  . Right shoulder pain   . Shoulder pain, left   . Thyroid disease     Past Surgical History:  Procedure Laterality Date  . CERVICAL DISC SURGERY      Social History   Social History  . Marital status: Divorced    Spouse name: N/A  . Number of children: N/A  . Years of education: N/A   Occupational History  . Not on file.   Social History Main Topics  . Smoking status: Current Every Day Smoker   Packs/day: 1.00    Types: Cigarettes    Start date: 09/04/1981  . Smokeless tobacco: Never Used  . Alcohol use No  . Drug use: No  . Sexual activity: Not on file   Other Topics Concern  . Not on file   Social History Narrative  . No narrative on file     No family history of premature CAD in 1st degree relatives.  Prior to Admission medications   Medication Sig Start Date End Date Taking? Authorizing Provider  amoxicillin-clavulanate (AUGMENTIN) 875-125 MG per tablet Take 1 tablet by mouth every 12 (twelve) hours. 11/14/14   Burgess Amor, PA-C  levothyroxine (SYNTHROID, LEVOTHROID) 25 MCG tablet Take 25 mcg by mouth daily.      Historical Provider, MD  Menthol, Topical Analgesic, (PAIN RELIEVING PATCH) 5 % PADS Apply 1 application topically as needed. For pain     Historical Provider, MD  predniSONE (DELTASONE) 5 MG tablet Take 5 mg by mouth daily with breakfast. Tapering dose 30 mg to 2.5 mg over one month, currently taking 20 mg qd    Historical Provider, MD  SALINE NASAL MIST NA Place 2 sprays into the nose as needed. Sinus congestion     Historical Provider, MD     Review of systems complete and found to be negative unless listed above in HPI     Physical exam Blood pressure 101/68, pulse 81, height 5'  2" (1.575 m), weight 216 lb (98 kg), SpO2 96 %. General: NAD Neck: No JVD, no thyromegaly or thyroid nodule.  Lungs: Clear to auscultation bilaterally with normal respiratory effort. CV: Nondisplaced PMI. Regular rate and rhythm, normal S1/S2, no S3/S4, no murmur.  Trace periankle and pretibial edema.  No carotid bruit.  Abdomen: Soft, nontender, obese.  Skin: Intact without lesions or rashes.  Neurologic: Alert and oriented x 3.  Psych: Normal affect. Extremities: No clubbing or cyanosis.  HEENT: Normal.   ECG: Most recent ECG reviewed.  Labs:   Lab Results  Component Value Date   WBC 12.1 (H) 08/27/2011   HGB 12.6 08/27/2011   HCT 38.9 08/27/2011   MCV 87.0  08/27/2011   PLT 268 08/27/2011   No results for input(s): NA, K, CL, CO2, BUN, CREATININE, CALCIUM, PROT, BILITOT, ALKPHOS, ALT, AST, GLUCOSE in the last 168 hours.  Invalid input(s): LABALBU No results found for: CKTOTAL, CKMB, CKMBINDEX, TROPONINI No results found for: CHOL No results found for: HDL No results found for: LDLCALC No results found for: TRIG No results found for: CHOLHDL No results found for: LDLDIRECT       Studies: No results found.  ASSESSMENT AND PLAN:  1. Chest pain/DOE: I will proceed with a nuclear myocardial perfusion imaging study (Lexiscan) to evaluate for ischemic heart disease.  2. Bilateral leg edema: I will order a 2-D echocardiogram with Doppler to evaluate cardiac structure, function, and regional wall motion.   Dispo: fu 6-8 wks   Signed: Prentice Docker, M.D., F.A.C.C.  09/09/2016, 1:49 PM

## 2016-09-20 ENCOUNTER — Other Ambulatory Visit: Payer: Self-pay

## 2016-09-20 ENCOUNTER — Ambulatory Visit (INDEPENDENT_AMBULATORY_CARE_PROVIDER_SITE_OTHER): Payer: Self-pay

## 2016-09-20 DIAGNOSIS — R0609 Other forms of dyspnea: Secondary | ICD-10-CM

## 2016-09-20 DIAGNOSIS — R0600 Dyspnea, unspecified: Secondary | ICD-10-CM

## 2016-09-20 DIAGNOSIS — R0789 Other chest pain: Secondary | ICD-10-CM

## 2016-09-21 ENCOUNTER — Telehealth: Payer: Self-pay | Admitting: *Deleted

## 2016-09-21 NOTE — Telephone Encounter (Signed)
Notes Recorded by Lesle Chris, LPN on 93/57/0177 at 2:23 PM EDT Patient notified. Copy to pmd. Stress test scheduled 09/26/2016. Follow up scheduled with Dr. Purvis Sheffield for 10/31/2016. ------  Notes Recorded by Laqueta Linden, MD on 09/21/2016 at 8:22 AM EDT Normal cardiac function.

## 2016-09-26 ENCOUNTER — Encounter (HOSPITAL_COMMUNITY)
Admission: RE | Admit: 2016-09-26 | Discharge: 2016-09-26 | Disposition: A | Discharge status: Home / Self Care | Payer: BLUE CROSS/BLUE SHIELD | Source: Ambulatory Visit | Attending: Cardiovascular Disease | Admitting: Cardiovascular Disease

## 2016-09-26 ENCOUNTER — Encounter (HOSPITAL_COMMUNITY): Payer: Self-pay

## 2016-09-26 ENCOUNTER — Other Ambulatory Visit: Payer: Self-pay | Admitting: Rheumatology

## 2016-09-26 ENCOUNTER — Inpatient Hospital Stay (HOSPITAL_COMMUNITY): Admission: RE | Admit: 2016-09-26 | Payer: BLUE CROSS/BLUE SHIELD | Source: Ambulatory Visit

## 2016-09-26 DIAGNOSIS — R0609 Other forms of dyspnea: Secondary | ICD-10-CM | POA: Insufficient documentation

## 2016-09-26 DIAGNOSIS — R0600 Dyspnea, unspecified: Secondary | ICD-10-CM

## 2016-09-26 DIAGNOSIS — R0789 Other chest pain: Secondary | ICD-10-CM | POA: Insufficient documentation

## 2016-09-26 LAB — NM MYOCAR MULTI W/SPECT W/WALL MOTION / EF
LV dias vol: 48 mL (ref 46–106)
LV sys vol: 8 mL
Peak HR: 116 {beats}/min
RATE: 0.36
Rest HR: 70 {beats}/min
SDS: 5
SRS: 1
SSS: 6
TID: 0.98

## 2016-09-26 MED ORDER — REGADENOSON 0.4 MG/5ML IV SOLN
INTRAVENOUS | Status: AC
  Administered 2016-09-26: 0.4 mg via INTRAVENOUS
  Filled 2016-09-26: qty 5

## 2016-09-26 MED ORDER — TECHNETIUM TC 99M TETROFOSMIN IV KIT
10.0000 | PACK | Freq: Once | INTRAVENOUS | Status: AC | PRN
  Administered 2016-09-26: 9.7 via INTRAVENOUS

## 2016-09-26 MED ORDER — SODIUM CHLORIDE 0.9% FLUSH
INTRAVENOUS | Status: AC
  Administered 2016-09-26: 10 mL via INTRAVENOUS
  Filled 2016-09-26: qty 10

## 2016-09-26 MED ORDER — TECHNETIUM TC 99M TETROFOSMIN IV KIT
30.0000 | PACK | Freq: Once | INTRAVENOUS | Status: AC | PRN
  Administered 2016-09-26: 28 via INTRAVENOUS

## 2016-09-27 ENCOUNTER — Telehealth: Payer: Self-pay | Admitting: Radiology

## 2016-09-27 NOTE — Telephone Encounter (Signed)
08/29/16 last visit Sent message for appt to be made for her, she is due in Feb.

## 2016-09-27 NOTE — Telephone Encounter (Signed)
Called patient to schedule Feb appt and patient did not answer and mailbox was full.

## 2016-09-27 NOTE — Telephone Encounter (Signed)
Patient needs follow up appt with Dr Corliss Skains in Feb pls call to make appt.

## 2016-10-03 ENCOUNTER — Telehealth: Payer: Self-pay | Admitting: *Deleted

## 2016-10-03 NOTE — Telephone Encounter (Signed)
Notes Recorded by Lesle Chris, LPN on 40/34/7425 at 10:14 AM EDT Patient notified and verbalized understanding. Copy to pmd. Follow up scheduled for 10/31/2016 with Dr. Purvis Sheffield. ------  Notes Recorded by Antoine Poche, MD on 09/28/2016 at 2:53 PM EDT Normal stress test. Dr Kirtland Bouchard to discuss further at f/u

## 2016-10-25 ENCOUNTER — Telehealth: Payer: Self-pay | Admitting: Rheumatology

## 2016-10-25 NOTE — Telephone Encounter (Signed)
Last office note states History of positive rheumatoid factor, positive CCP and joint pain and swelling.  She was on methotrexate for a while, but she discontinued the medication and last followup for the last 2 years almost.  She states she has been having increased joint pain, joint swelling and stiffness.  Her psoriasis is also getting worse.  She is interested in maybe getting methotrexate next time.   She will need appt to proceed with this. I will call her .

## 2016-10-25 NOTE — Telephone Encounter (Signed)
Patient is having a flare of psoriasis and her hair is falling out. I offered her an appointment tomorrow but she declined.

## 2016-10-25 NOTE — Telephone Encounter (Signed)
Called patient to discuss.  Left message for her to call back and make an appointment

## 2016-10-31 ENCOUNTER — Encounter: Payer: Self-pay | Admitting: Cardiovascular Disease

## 2016-10-31 ENCOUNTER — Ambulatory Visit (INDEPENDENT_AMBULATORY_CARE_PROVIDER_SITE_OTHER): Payer: BLUE CROSS/BLUE SHIELD | Admitting: Cardiovascular Disease

## 2016-10-31 VITALS — BP 123/72 | HR 76 | Ht 62.0 in | Wt 215.2 lb

## 2016-10-31 DIAGNOSIS — R0789 Other chest pain: Secondary | ICD-10-CM

## 2016-10-31 DIAGNOSIS — R0609 Other forms of dyspnea: Secondary | ICD-10-CM | POA: Diagnosis not present

## 2016-10-31 DIAGNOSIS — R0600 Dyspnea, unspecified: Secondary | ICD-10-CM

## 2016-10-31 DIAGNOSIS — Z136 Encounter for screening for cardiovascular disorders: Secondary | ICD-10-CM

## 2016-10-31 DIAGNOSIS — R600 Localized edema: Secondary | ICD-10-CM

## 2016-10-31 DIAGNOSIS — IMO0001 Reserved for inherently not codable concepts without codable children: Secondary | ICD-10-CM

## 2016-10-31 NOTE — Progress Notes (Signed)
      SUBJECTIVE: The patient returns for follow-up after undergoing cardiovascular testing performed for the evaluation of chest pain and exertional dyspnea.  Nuclear stress test 09/26/16 was normal..  Echocardiogram was also normal, LVEF 60-65%.  She has rheumatoid arthritis and has not been on treatment for the past 2 years.  She also has seasonal allergies.   Review of Systems: As per "subjective", otherwise negative.  No Known Allergies  Current Outpatient Prescriptions  Medication Sig Dispense Refill  . fluticasone (FLONASE) 50 MCG/ACT nasal spray Place 2 sprays into both nostrils daily as needed.  10  . levothyroxine (SYNTHROID, LEVOTHROID) 50 MCG tablet Take 1 tablet by mouth daily.  3  . Minoxidil (ROGAINE WOMENS) 5 % FOAM Apply topically every other day.    Marland Kitchen tiZANidine (ZANAFLEX) 4 MG tablet TAKE 1 TABLET BY MOUTH EVERY NIGHT AT BEDTIME AS NEEDED 30 tablet 2   No current facility-administered medications for this visit.     Past Medical History:  Diagnosis Date  . Rheumatoid arthritis(714.0) 8/13  . Right shoulder pain   . Shoulder pain, left   . Thyroid disease     Past Surgical History:  Procedure Laterality Date  . CERVICAL DISC SURGERY      Social History   Social History  . Marital status: Divorced    Spouse name: N/A  . Number of children: N/A  . Years of education: N/A   Occupational History  . Not on file.   Social History Main Topics  . Smoking status: Current Every Day Smoker    Packs/day: 1.00    Types: Cigarettes    Start date: 09/04/1981  . Smokeless tobacco: Never Used  . Alcohol use No  . Drug use: No  . Sexual activity: Not on file   Other Topics Concern  . Not on file   Social History Narrative  . No narrative on file     Vitals:   10/31/16 1123  BP: 123/72  Pulse: 76  SpO2: 98%  Weight: 215 lb 3.2 oz (97.6 kg)  Height: 5\' 2"  (1.575 m)    PHYSICAL EXAM General: NAD Neck: No JVD, no thyromegaly or thyroid  nodule.  Lungs: Clear to auscultation bilaterally with normal respiratory effort. CV: Nondisplaced PMI. Regular rate and rhythm, normal S1/S2, no S3/S4, no murmur.  Trace periankle and pretibial edema.  No carotid bruit.  Abdomen: Soft, nontender, obese.  Skin: Intact without lesions or rashes.  Neurologic: Alert and oriented x 3.  Psych: Normal affect. Extremities: No clubbing or cyanosis.  HEENT: Normal.      ECG: Most recent ECG reviewed.      ASSESSMENT AND PLAN: 1. Chest pain/DOE: Normal stress test and echocardiogram. No further CV testing indicated. Given her history of rheumatoid arthritis, pulmonary function testing is likely indicated.  2. Bilateral leg edema: Echo was normal. No further CV testing indicated.  Dispo: fu prn   , M.D., F.A.C.C.

## 2016-10-31 NOTE — Patient Instructions (Signed)
Medication Instructions:  Continue all current medications.  Labwork: none  Testing/Procedures: none  Follow-Up: As needed.    Any Other Special Instructions Will Be Listed Below (If Applicable).  If you need a refill on your cardiac medications before your next appointment, please call your pharmacy.  

## 2016-11-14 NOTE — Progress Notes (Deleted)
   Office Visit Note  Patient: Amy Mccarthy             Date of Birth: [birth year]           MRN: [id number]             PCP: Cassell Smiles., MD Referring: Elfredia Nevins, MD Visit Date: 11/18/2016 Occupation: @GUAROCC @    Subjective:  No chief complaint on file.   History of Present Illness: Amy Mccarthy is a 50 y.o. female ***   Activities of Daily Living:  Patient reports morning stiffness for *** {minute/hour:19697}.   Patient {ACTIONS;DENIES/REPORTS:21021675::"Denies"} nocturnal pain.  Difficulty dressing/grooming: {ACTIONS;DENIES/REPORTS:21021675::"Denies"} Difficulty climbing stairs: {ACTIONS;DENIES/REPORTS:21021675::"Denies"} Difficulty getting out of chair: {ACTIONS;DENIES/REPORTS:21021675::"Denies"} Difficulty using hands for taps, buttons, cutlery, and/or writing: {ACTIONS;DENIES/REPORTS:21021675::"Denies"}   No Rheumatology ROS completed.   PMFS History:  There are no active problems to display for this patient.   Past Medical History:  Diagnosis Date  . Rheumatoid arthritis(714.0) 8/13  . Right shoulder pain   . Shoulder pain, left   . Thyroid disease     No family history on file. Past Surgical History:  Procedure Laterality Date  . CERVICAL DISC SURGERY     Social History   Social History Narrative  . No narrative on file     Objective: Vital Signs: There were no vitals taken for this visit.   Physical Exam   Musculoskeletal Exam: ***  CDAI Exam: No CDAI exam completed.    Investigation: No additional findings.   Imaging: No results found.  Speciality Comments: No specialty comments available.    Procedures:  No procedures performed Allergies: Patient has no known allergies.   Assessment / Plan:     Visit Diagnoses: No diagnosis found.    Orders: No orders of the defined types were placed in this encounter.  No orders of the defined types were placed in this encounter.   Face-to-face time spent with patient was  *** minutes. 50% of time was spent in counseling and coordination of care.  Follow-Up Instructions: No Follow-up on file.   9/13, PA-C

## 2016-11-15 DIAGNOSIS — L409 Psoriasis, unspecified: Secondary | ICD-10-CM | POA: Insufficient documentation

## 2016-11-15 DIAGNOSIS — M797 Fibromyalgia: Secondary | ICD-10-CM | POA: Insufficient documentation

## 2016-11-15 DIAGNOSIS — M0579 Rheumatoid arthritis with rheumatoid factor of multiple sites without organ or systems involvement: Secondary | ICD-10-CM | POA: Insufficient documentation

## 2016-11-15 DIAGNOSIS — Z79899 Other long term (current) drug therapy: Secondary | ICD-10-CM | POA: Insufficient documentation

## 2016-11-15 DIAGNOSIS — F17200 Nicotine dependence, unspecified, uncomplicated: Secondary | ICD-10-CM | POA: Insufficient documentation

## 2016-11-15 DIAGNOSIS — M47816 Spondylosis without myelopathy or radiculopathy, lumbar region: Secondary | ICD-10-CM | POA: Insufficient documentation

## 2016-11-15 DIAGNOSIS — E039 Hypothyroidism, unspecified: Secondary | ICD-10-CM | POA: Insufficient documentation

## 2016-11-15 NOTE — Progress Notes (Signed)
Office Visit Note  Patient: Amy Mccarthy             Date of Birth: 1966-04-16           MRN: [id number]             PCP: Glo Herring., MD Referring: Redmond School, MD Visit Date: 11/16/2016 Occupation: Hair dresser    Subjective:  Pain hands   History of Present Illness: Amy Mccarthy is a 50 y.o. female who has known history of rheumatoid arthritis with positive rheumatoid factor, positive anti- CCP, positive 14-3-3 eta and positive ANA. She's been in remission since 2015. She was treated with methotrexate in the past. She continues to have pain and discomfort in her hands but no synovitis on clinical examination. She also has underlying history of fibromyalgia syndrome. She states she continues to have some discomfort in her hands and feet but no obvious swelling. She does have significant discomfort. She's also concerned about psoriasis she states she has psoriasis patch on her scalp where she has lost hair . She also has psoriasis in her ear canal and nape of her neck.    Activities of Daily Living:  Patient reports morning stiffness for 30 minutes.   Patient Denies nocturnal pain.  Difficulty dressing/grooming: Denies Difficulty climbing stairs: Reports Difficulty getting out of chair: Reports Difficulty using hands for taps, buttons, cutlery, and/or writing: Reports   Review of Systems  Constitutional: Positive for fatigue. Negative for night sweats, weight gain, weight loss and weakness.  HENT: Negative for mouth sores, trouble swallowing, trouble swallowing, mouth dryness and nose dryness.   Eyes: Positive for dryness. Negative for pain, redness and visual disturbance.  Respiratory: Negative for cough, shortness of breath and difficulty breathing.   Cardiovascular: Negative for chest pain, palpitations, hypertension, irregular heartbeat and swelling in legs/feet.  Gastrointestinal: Negative for blood in stool, constipation and diarrhea.  Endocrine: Negative for  increased urination.  Genitourinary: Negative for vaginal dryness.  Musculoskeletal: Positive for arthralgias, joint pain, myalgias, morning stiffness and myalgias. Negative for joint swelling, muscle weakness and muscle tenderness.  Skin: Positive for rash. Negative for color change, hair loss, skin tightness, ulcers and sensitivity to sunlight.       Psoriasis over nape of the neck and ear canal. She also reports nail dystrophy and nail pitting I could not visualize it because of nail polish  Allergic/Immunologic: Negative for susceptible to infections.  Neurological: Negative for dizziness, memory loss and night sweats.  Hematological: Negative for swollen glands.  Psychiatric/Behavioral: Positive for depressed mood and sleep disturbance. The patient is nervous/anxious.     PMFS History:  Patient Active Problem List   Diagnosis Date Noted  . Rheumatoid arthritis 11/15/2016  . High risk medication use 11/15/2016  . Fibromyalgia 11/15/2016  . Psoriasis 11/15/2016  . 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 Smoker 11/15/2016  . Hypothyroidism 11/15/2016    Past Medical History:  Diagnosis Date  . Rheumatoid arthritis(714.0) 8/13  . Right shoulder pain   . Shoulder pain, left   . Thyroid disease     No family history on file. Past Surgical History:  Procedure Laterality Date  . CERVICAL DISC SURGERY     Social History   Social History Narrative  . No narrative on file     Objective: Vital Signs: BP 116/67 (BP Location: Left Arm, Patient Position: Sitting, Cuff Size: Large)   Pulse 86   Resp 14   Ht '5\' 2"'$  (  1.575 m)   Wt 210 lb (95.3 kg)   BMI 38.41 kg/m    Physical Exam  Constitutional: She is oriented to person, place, and time. She appears well-developed and well-nourished.  HENT:  Head: Normocephalic and atraumatic.  Eyes: Conjunctivae and EOM are normal.  Neck: Normal range of motion.  Cardiovascular: Normal rate, regular rhythm, normal heart  sounds and intact distal pulses.   Pulmonary/Chest: Effort normal and breath sounds normal.  Abdominal: Soft. Bowel sounds are normal.  Lymphadenopathy:    She has no cervical adenopathy.  Neurological: She is alert and oriented to person, place, and time.  Skin: Skin is warm and dry. Capillary refill takes less than 2 seconds.  Psychiatric: She has a normal mood and affect. Her behavior is normal.  Nursing note and vitals reviewed.    Musculoskeletal Exam: C-spine, thoracic, lumbar spine good range of motion she has some thoracic kyphosis. Shoulder joints, elbow joints, wrist joints are good range of motion she has good range of motion of her MCPs PIPs DIPs with no synovitis. She has tenderness across her wrists joints and MCP joints. She is good range of motion of her hip joints knee joints ankles MTPs PIPs DIPs with some tenderness across MTP joints. No synovitis was noted.  CDAI Exam: CDAI Homunculus Exam:   Tenderness:  RUE: wrist LUE: wrist Right hand: 2nd MCP and 3rd MCP  Joint Counts:  CDAI Tender Joint count: 4 CDAI Swollen Joint count: 0  Global Assessments:  Patient Global Assessment: 4 Provider Global Assessment: 4  CDAI Calculated Score: 12    Investigation: Findings:  . 08/29/2016 CBC normal, CMP normal, ESR 9, CCP antibody more than 250, 14 33 eta.2.1 which is positive. 09/23/2013 HIV negative, 06/27/2012 TB gold negative hepatitis panel negative and chest x-ray normal    Imaging: [place] Extrem Up Bilat Comp  Result Date: 11/16/2016 Ultrasound examination of bilateral hands was performed per EULAR recommendations. Using 12 MHz transducer, grayscale and power Doppler bilateral second, third, and fifth MCP joints and bilateral wrist joints both dorsal and volar aspects were evaluated to look for synovitis or tenosynovitis. The findings were there was mild synovitis and synovial thickening in right third MCP joint and left third MCP joint. Bilateral wrists joint over  the radial aspect.  on ultrasound examination. Right median nerve was 0. 26 cm squares which was enlarged and left median nerve was 0.11 cm squares which was within normal limits. Impression: Ultrasound examination was consistent with inflammatory arthritis with synovitis in bilateral third MCP joints and wrists joints. Right median nerve was enlarged but she has no symptoms of carpal tunnel syndrome.   Speciality Comments: No specialty comments available.    Procedures:  No procedures performed Allergies: Patient has no known allergies.   Assessment / Plan:     Visit Diagnoses: Rheumatoid arthritis - Positive rheumatoid factor, positive CCP, positive ANA, +14 33 eta treated with methotrexate in the past - Plan: [place] Extrem Up Bilat Comp. She did have some mild synovitis on the ultrasound examination over her MCPs and wrists joints. Her right median nerve was enlarged but she had no symptoms of carpal tunnel syndrome.  High risk medication use - Off methotrexate since 2015. We had detailed discussion about restarting on methotrexate indications side effects contraindications were discussed at length. Handout was given consent was taken. She'll be starting on 4 tablets of methotrexate along with folic acid and will increase the dose every 2 weeks up to 8 tablets per week  as tolerated we will get labs CBC comprehensive metabolic panel every 2 weeks 3 and then every 2 months to monitor for drug toxicity.  Fibromyalgia: She continues to have some generalized pain and discomfort.  Psoriasis she had some erythematous rash over the deep of her neck and her ear canals according to patient this been diagnosed as psoriasis by dermatologist.  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  Smoker: Smoking cessation was discussed.  Hypothyroidism, unspecified type    Orders: Orders Placed This Encounter  Procedures  . [place] Extrem Up Bilat Comp  . CBC with  Differential/Platelet  . COMPLETE METABOLIC PANEL WITH GFR   Meds ordered this encounter  Medications  . methotrexate (RHEUMATREX) 2.5 MG tablet    Sig: Take 4 tablets by mouth weekly for two weeks, then increase to 6 tablets by mouth weekly for two weeks. then increase to 8 tablets weekly.    Dispense:  84 tablet    Refill:  0  . folic acid (FOLVITE) 1 MG tablet    Sig: Take 2 tablets (2 mg total) by mouth daily.    Dispense:  180 tablet    Refill:  3    Face-to-face time spent with patient was 40 minutes. 50% of time was spent in counseling and coordination of care.  Follow-Up Instructions: Return in about 3 months (around 02/14/2017) for Rheumatoid arthritis.   Bo Merino, MD

## 2016-11-16 ENCOUNTER — Encounter: Payer: Self-pay | Admitting: Rheumatology

## 2016-11-16 ENCOUNTER — Ambulatory Visit (INDEPENDENT_AMBULATORY_CARE_PROVIDER_SITE_OTHER): Payer: BLUE CROSS/BLUE SHIELD | Admitting: Rheumatology

## 2016-11-16 ENCOUNTER — Inpatient Hospital Stay (INDEPENDENT_AMBULATORY_CARE_PROVIDER_SITE_OTHER): Payer: Self-pay

## 2016-11-16 VITALS — BP 116/67 | HR 86 | Resp 14 | Ht 62.0 in | Wt 210.0 lb

## 2016-11-16 DIAGNOSIS — Z79899 Other long term (current) drug therapy: Secondary | ICD-10-CM | POA: Diagnosis not present

## 2016-11-16 DIAGNOSIS — M797 Fibromyalgia: Secondary | ICD-10-CM | POA: Diagnosis not present

## 2016-11-16 DIAGNOSIS — L409 Psoriasis, unspecified: Secondary | ICD-10-CM | POA: Diagnosis not present

## 2016-11-16 DIAGNOSIS — M0579 Rheumatoid arthritis with rheumatoid factor of multiple sites without organ or systems involvement: Secondary | ICD-10-CM

## 2016-11-16 DIAGNOSIS — E039 Hypothyroidism, unspecified: Secondary | ICD-10-CM | POA: Diagnosis not present

## 2016-11-16 DIAGNOSIS — F17200 Nicotine dependence, unspecified, uncomplicated: Secondary | ICD-10-CM

## 2016-11-16 DIAGNOSIS — M47816 Spondylosis without myelopathy or radiculopathy, lumbar region: Secondary | ICD-10-CM

## 2016-11-16 MED ORDER — FOLIC ACID 1 MG PO TABS: 2.0000 mg | ORAL_TABLET | Freq: Every day | ORAL | 3 refills | Status: DC

## 2016-11-16 MED ORDER — METHOTREXATE 2.5 MG PO TABS: ORAL_TABLET | ORAL | 0 refills | Status: DC

## 2016-11-16 NOTE — Patient Instructions (Addendum)
Standing Labs We placed an order today for your standing lab work.    Please come back and get your standing labs every 2 weeks times 3, then every 2 months.    We have open lab Monday through Friday from 8:30-11:30 AM and 1:30-4 PM at the office of Dr. Shaili Deveshwar/Naitik Panwala, PA.   The office is located at [address], [address], [place], [place] [address] No appointment is necessary.   Labs are drawn by [facility].  You may receive a bill from [facility] for your lab work.     Methotrexate tablets What is this medicine? METHOTREXATE (METH oh TREX ate) is a chemotherapy drug used to treat cancer including breast cancer, leukemia, and lymphoma. This medicine can also be used to treat psoriasis and certain kinds of arthritis. This medicine may be used for other purposes; ask your health care provider or pharmacist if you have questions. COMMON BRAND NAME(S): Rheumatrex, Trexall What should I tell my health care provider before I take this medicine? They need to know if you have any of these conditions: -fluid in the stomach area or lungs -if you often drink alcohol -infection or immune system problems -kidney disease or on hemodialysis -liver disease -low blood counts, like low white cell, platelet, or red cell counts -lung disease -radiation therapy -stomach ulcers -ulcerative colitis -an unusual or allergic reaction to methotrexate, other medicines, foods, dyes, or preservatives -pregnant or trying to get pregnant -breast-feeding How should I use this medicine? Take this medicine by mouth with a glass of water. Follow the directions on the prescription label. Take your medicine at regular intervals. Do not take it more often than directed. Do not stop taking except on your doctor's advice. Make sure you know why you are taking this medicine and how often you should take it. If this medicine is used for a condition that is not cancer, like arthritis or psoriasis, it should be  taken weekly, NOT daily. Taking this medicine more often than directed can cause serious side effects, even death. Talk to your healthcare provider about safe handling and disposal of this medicine. You may need to take special precautions. Talk to your pediatrician regarding the use of this medicine in children. While this drug may be prescribed for selected conditions, precautions do apply. Overdosage: If you think you have taken too much of this medicine contact a poison control center or emergency room at once. NOTE: This medicine is only for you. Do not share this medicine with others. What if I miss a dose? If you miss a dose, talk with your doctor or health care professional. Do not take double or extra doses. What may interact with this medicine? This medicine may interact with the following medication: -acitretin -aspirin and aspirin-like medicines including salicylates -azathioprine -certain antibiotics like penicillins, tetracycline, and chloramphenicol -cyclosporine -gold -hydroxychloroquine -live virus vaccines -NSAIDs, medicines for pain and inflammation, like ibuprofen or naproxen -other cytotoxic agents -penicillamine -phenylbutazone -phenytoin -probenecid -retinoids such as isotretinoin and tretinoin -steroid medicines like prednisone or cortisone -sulfonamides like sulfasalazine and trimethoprim/sulfamethoxazole -theophylline This list may not describe all possible interactions. Give your health care provider a list of all the medicines, herbs, non-prescription drugs, or dietary supplements you use. Also tell them if you smoke, drink alcohol, or use illegal drugs. Some items may interact with your medicine. What should I watch for while using this medicine? Avoid alcoholic drinks. This medicine can make you more sensitive to the sun. Keep out of the sun.   If you cannot avoid being in the sun, wear protective clothing and use sunscreen. Do not use sun lamps or tanning  beds/booths. You may need blood work done while you are taking this medicine. Call your doctor or health care professional for advice if you get a fever, chills or sore throat, or other symptoms of a cold or flu. Do not treat yourself. This drug decreases your body's ability to fight infections. Try to avoid being around people who are sick. This medicine may increase your risk to bruise or bleed. Call your doctor or health care professional if you notice any unusual bleeding. Check with your doctor or health care professional if you get an attack of severe diarrhea, nausea and vomiting, or if you sweat a lot. The loss of too much body fluid can make it dangerous for you to take this medicine. Talk to your doctor about your risk of cancer. You may be more at risk for certain types of cancers if you take this medicine. Both men and women must use effective birth control with this medicine. Do not become pregnant while taking this medicine or until at least 1 normal menstrual cycle has occurred after stopping it. Women should inform their doctor if they wish to become pregnant or think they might be pregnant. Men should not father a child while taking this medicine and for 3 months after stopping it. There is a potential for serious side effects to an unborn child. Talk to your health care professional or pharmacist for more information. Do not breast-feed an infant while taking this medicine. What side effects may I notice from receiving this medicine? Side effects that you should report to your doctor or health care professional as soon as possible: -allergic reactions like skin rash, itching or hives, swelling of the face, lips, or tongue -breathing problems or shortness of breath -diarrhea -dry, nonproductive cough -low blood counts - this medicine may decrease the number of white blood cells, red blood cells and platelets. You may be at increased risk for infections and bleeding. -mouth  sores -redness, blistering, peeling or loosening of the skin, including inside the mouth -signs of infection - fever or chills, cough, sore throat, pain or trouble passing urine -signs and symptoms of bleeding such as bloody or black, tarry stools; red or dark-brown urine; spitting up blood or brown material that looks like coffee grounds; red spots on the skin; unusual bruising or bleeding from the eye, gums, or nose -signs and symptoms of kidney injury like trouble passing urine or change in the amount of urine -signs and symptoms of liver injury like dark yellow or brown urine; general ill feeling or flu-like symptoms; light-colored stools; loss of appetite; nausea; right upper belly pain; unusually weak or tired; yellowing of the eyes or skin Side effects that usually do not require medical attention (report to your doctor or health care professional if they continue or are bothersome): -dizziness -hair loss -tiredness -upset stomach -vomiting This list may not describe all possible side effects. Call your doctor for medical advice about side effects. You may report side effects to FDA at 1-800-FDA-1088. Where should I keep my medicine? Keep out of the reach of children. Store at room temperature between 20 and 25 degrees C (68 and 77 degrees F). Protect from light. Throw away any unused medicine after the expiration date. NOTE: This sheet is a summary. It may not cover all possible information. If you have questions about this medicine, talk to your   doctor, pharmacist, or health care provider.  2017 Elsevier/Gold Standard (2015-07-27 05:39:22)  

## 2016-11-16 NOTE — Progress Notes (Signed)
Pharmacy Note Subjective: Patient presents today to the [facility] to see Dr. Corliss Skains.  Patient seen by the pharmacist for counseling on methotrexate.    Objective: CBC: normal on 08/29/16 CMP: normal on 08/29/16  TB Gold: negative (06/27/12) Hepatitis panel: negative (06/27/12) HIV: negative (09/23/13)  Chest-xray:  07/12/12  Assessment/Plan:  Patient was counseled on the purpose, proper use, and adverse effects of methotrexate including nausea, infection, and signs and symptoms of pneumonitis.  Reviewed instructions with patient to take methotrexate weekly along with folic acid daily.  Discussed the importance of frequent monitoring of kidney and liver function and blood counts, and provided patient with standing lab instructions.  Counseled patient to avoid sulfa antibiotics such as Bactrim or Septra while on methotrexate.  Provided patient with educational materials on methotrexate and answered all questions.  Advised patient to get annual influenza vaccine and to get a pneumococcal vaccine if patient has not already had one.  Patient voiced understanding.  Patient signed consent on methotrexate.  Will upload into her chart.    Lilla Shook, Pharm.D., BCPS Clinical Pharmacist Pager: [phone] Phone: [phone] 11/16/2016 4:28 PM

## 2016-11-18 ENCOUNTER — Ambulatory Visit: Payer: Self-pay | Admitting: Rheumatology

## 2017-02-27 ENCOUNTER — Ambulatory Visit (INDEPENDENT_AMBULATORY_CARE_PROVIDER_SITE_OTHER): Payer: BLUE CROSS/BLUE SHIELD | Admitting: Otolaryngology

## 2017-02-27 DIAGNOSIS — J343 Hypertrophy of nasal turbinates: Secondary | ICD-10-CM

## 2017-02-27 DIAGNOSIS — J310 Chronic rhinitis: Secondary | ICD-10-CM

## 2017-02-27 DIAGNOSIS — J342 Deviated nasal septum: Secondary | ICD-10-CM | POA: Diagnosis not present

## 2017-02-27 DIAGNOSIS — H9312 Tinnitus, left ear: Secondary | ICD-10-CM | POA: Diagnosis not present

## 2017-03-01 ENCOUNTER — Other Ambulatory Visit (INDEPENDENT_AMBULATORY_CARE_PROVIDER_SITE_OTHER): Payer: Self-pay | Admitting: Otolaryngology

## 2017-03-01 DIAGNOSIS — J320 Chronic maxillary sinusitis: Secondary | ICD-10-CM

## 2017-03-13 ENCOUNTER — Ambulatory Visit (HOSPITAL_COMMUNITY): Payer: BLUE CROSS/BLUE SHIELD

## 2017-03-16 ENCOUNTER — Ambulatory Visit (HOSPITAL_COMMUNITY)
Admission: RE | Admit: 2017-03-16 | Discharge: 2017-03-16 | Disposition: A | Discharge status: Home / Self Care | Payer: BLUE CROSS/BLUE SHIELD | Source: Ambulatory Visit | Attending: Otolaryngology | Admitting: Otolaryngology

## 2017-03-16 DIAGNOSIS — J320 Chronic maxillary sinusitis: Secondary | ICD-10-CM | POA: Diagnosis present

## 2017-04-17 ENCOUNTER — Ambulatory Visit (INDEPENDENT_AMBULATORY_CARE_PROVIDER_SITE_OTHER): Payer: BLUE CROSS/BLUE SHIELD | Admitting: Otolaryngology

## 2017-04-17 DIAGNOSIS — J310 Chronic rhinitis: Secondary | ICD-10-CM | POA: Diagnosis not present

## 2017-04-17 DIAGNOSIS — J343 Hypertrophy of nasal turbinates: Secondary | ICD-10-CM

## 2017-04-17 DIAGNOSIS — J342 Deviated nasal septum: Secondary | ICD-10-CM | POA: Diagnosis not present

## 2017-05-15 ENCOUNTER — Ambulatory Visit (INDEPENDENT_AMBULATORY_CARE_PROVIDER_SITE_OTHER): Payer: BLUE CROSS/BLUE SHIELD | Admitting: Endocrinology

## 2017-05-15 ENCOUNTER — Encounter: Payer: Self-pay | Admitting: Endocrinology

## 2017-05-15 VITALS — BP 124/76 | HR 89 | Ht 62.0 in | Wt 197.0 lb

## 2017-05-15 DIAGNOSIS — E063 Autoimmune thyroiditis: Secondary | ICD-10-CM

## 2017-05-15 LAB — TSH: TSH: 1.48 u[IU]/mL (ref 0.35–4.50)

## 2017-05-15 LAB — T3, FREE: T3, Free: 3.4 pg/mL (ref 2.3–4.2)

## 2017-05-15 LAB — T4, FREE: Free T4: 0.91 ng/dL (ref 0.60–1.60)

## 2017-05-15 NOTE — Progress Notes (Signed)
Please call to let patient know that all the lab results are normal in the desired range and do not think changing levothyroxine will help She may discontinue her levothyroxine and come back in 6 weeks for follow-up, also follow-up with her rheumatologist

## 2017-05-15 NOTE — Progress Notes (Signed)
Patient ID: Amy Mccarthy, female   DOB: 06-Jul-1966, 51 y.o.   MRN: [id number]             Referring Physician: Sherwood Gambler  Reason for Appointment:  Hypothyroidism, new visit    History of Present Illness:   Hypothyroidism was first diagnosed  about 15 years ago   At the time of diagnosis patient was having symptoms of  fatigue, some hair loss and achiness  The patient has been treated with  low doses of levothyroxine and has been taking 50 g without change for several years.   With starting thyroid supplementation the patient's symptoms did not apparently improve but she does not remember well However she things that she is having the same symptoms now as she did 15 years ago  She is concerned about her staying tired and having no energy. She also has difficulty sleeping at night She is also concerned about her aching in several muscles         Patient's weight history is as follows:  Wt Readings from Last 3 Encounters:  05/15/17 197 lb (89.4 kg)  11/16/16 210 lb (95.3 kg)  10/31/16 215 lb 3.2 oz (97.6 kg)     Thyroid function results have been as follows:  Date   free T4   TSH   T3     02/20/17    1.69     12/14/16     2.93      4/24 /2017     2.1       No results found for: TSH, FREET4, T3FREE   Past Medical History:  Diagnosis Date  . Arthritis   . Rheumatoid arthritis(714.0) 8/13  . Right shoulder pain   . Shoulder pain, left   . Thyroid disease     Past Surgical History:  Procedure Laterality Date  . CERVICAL DISC SURGERY      Family History  Problem Relation Age of Onset  . Hypothyroidism Mother   . Hypothyroidism Maternal Grandmother     Social History:  reports that she has been smoking Cigarettes.  She started smoking about 35 years ago. She has been smoking about 1.00 pack per day. She has never used smokeless tobacco. She reports that she does not drink alcohol or use drugs.  Allergies: No Known Allergies  Allergies as of 05/15/2017   No  Known Allergies     Medication List       Accurate as of 05/15/17  2:57 PM. Always use your most recent med list.          Biotin 10 MG Caps Take by mouth. Takes one daily   fluticasone 50 MCG/ACT nasal spray Commonly known as:  FLONASE Place 2 sprays into both nostrils daily as needed.   folic acid 1 MG tablet Commonly known as:  FOLVITE Take 2 tablets (2 mg total) by mouth daily.   furosemide 20 MG tablet Commonly known as:  LASIX TK 1 T PO D PRN   levothyroxine 50 MCG tablet Commonly known as:  SYNTHROID, LEVOTHROID Take 1 tablet by mouth daily.   methotrexate 2.5 MG tablet Commonly known as:  RHEUMATREX Take 4 tablets by mouth weekly for two weeks, then increase to 6 tablets by mouth weekly for two weeks. then increase to 8 tablets weekly.   ROGAINE WOMENS 5 % Foam Generic drug:  Minoxidil Apply topically every other day.   tiZANidine 4 MG tablet Commonly known as:  ZANAFLEX TAKE 1 TABLET BY MOUTH EVERY  NIGHT AT BEDTIME AS NEEDED          Review of Systems  Cardiovascular: Positive for leg swelling.  Gastrointestinal: Positive for nausea.  Endocrine: Positive for fatigue.  Musculoskeletal: Positive for joint pain and muscle aches.       Has pains in her left arm and neck, legs mostly. Has mostly joint pains in her fingers  Skin: Positive for rash.       Psoriasis present Hair loss chronically also has a spot of alopecia on her scalp Dry hair present  Psychiatric/Behavioral: Positive for nervousness and insomnia.                Examination:    BP 124/76   Pulse 89   Ht 5\' 2"  (1.575 m)   Wt 197 lb (89.4 kg)   SpO2 97%   BMI 36.03 kg/m   GENERAL:  Relatively large build.  Has generalized obesity without cushingoid features  No pallor, clubbing, lymphadenopathy or edema.    Skin: Psoriasis-like lesions present  EYES:  No prominence of the eyes or swelling of the eyelids  ENT: Oral mucosa and tongue normal.  THYROID:  Not  palpable.  HEART:  Normal  S1 and S2; no murmur or click.  CHEST:    Lungs: Vescicular breath sounds heard equally.  No crepitations/ wheeze.  ABDOMEN:  No distention.  Liver and spleen not palpable.  No other mass or tenderness.  NEUROLOGICAL: Reflexes are bilaterally normal at ankles and at biceps.  JOINTS:  Normal peripheral joints   Assessment:  HYPOTHYROIDISM, mild and long-standing She is requiring only 50 g levothyroxine without any change in her requirement over 15 years Since she has a family history she likely has autoimmune thyroid disease  However she is complaining of fatigue which has been persistent even with starting thyroxine supplements in the past and this is unlikely to be related to her hypothyroidism She has multiple other medical issues including insomnia, probable fibromyalgia, rheumatoid arthritis that will cause her fatigue  PLAN:   Will check free T4 and free T3 levels Explained that if she has lower than expected free T3 level she may be symptomatically better with trying a combination of lower dose levothyroxine along with low-dose Cytomel However her free T4 level may be altered by taking biotin TSH was repeated also  Follow-up with her rheumatologist has been recommended so that she can take appropriate treatment for her fibromyalgia and she also needs better management of her sleep disorder  Follow-up to be decided   [facility] 05/15/2017, 2:57 PM   Consultation note copy sent to the PCP  Note: This office note was prepared with Dragon voice recognition system technology. Any transcriptional errors that result from this process are unintentional.

## 2017-07-24 ENCOUNTER — Ambulatory Visit: Payer: BLUE CROSS/BLUE SHIELD | Admitting: Endocrinology

## 2017-08-08 ENCOUNTER — Telehealth: Payer: Self-pay | Admitting: Rheumatology

## 2017-08-08 NOTE — Telephone Encounter (Signed)
Spoke with patient and offered her multiple appointments. Patient declined due to her work schedule. Patient states she would like to be put on the cancellation list to come in on a Monday. Patient advised we would do so.

## 2017-08-08 NOTE — Telephone Encounter (Signed)
Patient states has seen dermatologist, and derm requesting patient to see Dr. Corliss Skains for Psoriatic Arthritis .Patients hair is falling out a lot also. Patient confused as to whom she needs to see. Please call to advise patient.

## 2017-08-08 NOTE — Telephone Encounter (Signed)
Patient states she went to the dermatologist because she was having skin issues. Patient states she had it biopsied and states the dermatologist told her it was psoriatic arthritis. Patient states she she also has Psoriasis in her head. Patient states she is having issues with her hair falling out. The dermatologist is wanting her to come back and see [place] again. Patient states she is not currently on any medications. Patient is having dermatologist send over office notes and biopsy results.  Please advise.

## 2017-08-08 NOTE — Telephone Encounter (Signed)
schedule appointment.

## 2017-08-13 NOTE — Progress Notes (Signed)
Office Visit Note  Patient: Amy Mccarthy             Date of Birth: 04/09/1966           MRN: [id number]             PCP: Elfredia Nevins, MD Referring: Elfredia Nevins, MD Visit Date: 08/21/2017 Occupation: @GUAROCC @    Subjective:  Joint pain.   History of Present Illness: Amy Mccarthy is a 51 y.o. female with history of sero positive rheumatoid arthritis. She states she had rash on the back of her neck for which she was seen by dermatologist. She does skin biopsy which was positive for psoriasis. She was also seen by her ophthalmologist and was told that she had inflammation in her right eye. She does not recall the diagnosis. She's also noticed a small nodule on her right lower eyelid. She states ophthalmologist gave her treatment for herpes that she has history of shingles in the past. She has not noticed any improvement in that nodule since then. She states she has a stiff all the time in her joints hurt. She denies any particular joints been swollen. She decided not to take the prescription for methotrexate in December 2017. Patient states she continues to have hair loss despite not being on methotrexate for the last 2 years. She states the psoriasis has spread in her scalp in her ear canals in the nape of her neck. She's interested in starting on methotrexate now.  Chart review: Patient initially presented in July 2013 with inflammatory polyarthritis. She was given prednisone taper and she was treated with methotrexate. She was on methotrexate till 2015 and then discontinued the medication herself due to hair loss.  Activities of Daily Living:  Patient reports morning stiffness for .   Patient Denies nocturnal pain.  Difficulty dressing/grooming: Denies Difficulty climbing stairs: Denies Difficulty getting out of chair: Denies Difficulty using hands for taps, buttons, cutlery, and/or writing: Denies   Review of Systems  Constitutional: Positive for fatigue.  Negative for night sweats, weight gain, weight loss and weakness.  HENT: Positive for mouth dryness. Negative for mouth sores, trouble swallowing, trouble swallowing and nose dryness.   Eyes: Positive for dryness. Negative for pain, redness and visual disturbance.  Respiratory: Negative for cough, shortness of breath and difficulty breathing.   Cardiovascular: Negative for chest pain, palpitations, hypertension, irregular heartbeat and swelling in legs/feet.  Gastrointestinal: Negative for blood in stool, constipation and diarrhea.  Endocrine: Negative for increased urination.  Genitourinary: Negative for vaginal dryness.  Musculoskeletal: Positive for arthralgias, joint pain and morning stiffness. Negative for joint swelling, myalgias, muscle weakness, muscle tenderness and myalgias.  Skin: Positive for rash. Negative for color change, hair loss, skin tightness, ulcers and sensitivity to sunlight.  Allergic/Immunologic: Negative for susceptible to infections.  Neurological: Negative for dizziness, memory loss and night sweats.  Hematological: Negative for swollen glands.  Psychiatric/Behavioral: Positive for sleep disturbance. Negative for depressed mood. The patient is nervous/anxious.     PMFS History:  Patient Active Problem List   Diagnosis Date Noted  . Rheumatoid arthritis 11/15/2016  . High risk medication use 11/15/2016  . Fibromyalgia 11/15/2016  . Psoriasis 11/15/2016  . 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 Smoker 11/15/2016  . Hypothyroidism 11/15/2016    Past Medical History:  Diagnosis Date  . Arthritis   . Rheumatoid arthritis(714.0) 8/13  . Right shoulder pain   . Shoulder pain, left   . Thyroid disease  Family History  Problem Relation Age of Onset  . Hypothyroidism Mother   . Hypothyroidism Maternal Grandmother    Past Surgical History:  Procedure Laterality Date  . CERVICAL DISC SURGERY     Social History   Social History Narrative    . No narrative on file     Objective: Vital Signs: BP 130/78   Pulse 84   Resp 16   Ht 5\' 2"  (1.575 m)   Wt 195 lb (88.5 kg)   BMI 35.67 kg/m    Physical Exam  Constitutional: She is oriented to person, place, and time. She appears well-developed and well-nourished.  HENT:  Head: Normocephalic and atraumatic.  Eyes: Conjunctivae and EOM are normal.  Neck: Normal range of motion.  Cardiovascular: Normal rate, regular rhythm, normal heart sounds and intact distal pulses.   Pulmonary/Chest: Effort normal and breath sounds normal.  Abdominal: Soft. Bowel sounds are normal.  Lymphadenopathy:    She has no cervical adenopathy.  Neurological: She is alert and oriented to person, place, and time.  Skin: Skin is warm and dry. Capillary refill takes less than 2 seconds. Rash noted.  Erythematous pearly scale noted over the nape of her neck. She also has psoriasis patches in her scalp and ear canals. She has dry scaly rash on dorsum of her right wrist and her right foot.  Psychiatric: She has a normal mood and affect. Her behavior is normal.  Nursing note and vitals reviewed.    Musculoskeletal Exam: C-spine and thoracic lumbar spine good range of motion. Shoulder joints elbow joints wrist joint MCPs PIPs DIPs with good range of motion. She had mild tenderness over her wrist joints with no synovitis was noted. Hip joints knee joints ankles MTPs PIPs with good range of motion with no synovitis.  CDAI Exam: CDAI Homunculus Exam:   Joint Counts:  CDAI Tender Joint count: 0 CDAI Swollen Joint count: 0  Global Assessments:  Patient Global Assessment: 4 Provider Global Assessment: 2  CDAI Calculated Score: 6    Investigation: No additional findings.  Imaging: No results found.  Speciality Comments: No specialty comments available.    Procedures:  No procedures performed Allergies: Patient has no known allergies.   Assessment / Plan:     Visit Diagnoses: Rheumatoid  arthritis with positive rheumatoid factor, involving unspecified site (HCC) - Positive rheumatoid factor, positive CCP, positive ANA, +14 33 eta treated with methotrexate in the past . She initially presented with severe inflammatory polyarthritis treated with methotrexate and prednisone. She was on prednisone from 2013 to 2015. She's been experiencing increase arthralgias but has no synovitis on examination. She gives history of intermittent swelling in her wrist and hands. She was given a prescription for methotrexate in December 2017 which she did not start. She has been diagnosed with psoriasis by her dermatologist in her rash has become more severe. She also still reports having inflammation in her eye diagnosed by her ophthalmologist. I do not have any of those records available. We had detailed discussion regarding restarting methotrexate. Patient was in agreement. We obtained an informed consent today. I will start her on methotrexate 6 tablets by mouth every week along with folic acid. Will check labs and 2 weeks, 2 months and then every 3 months. She's experiencing some hair loss. I made her aware that methotrexate can cause increased hair loss in some patients.  High risk medication use - MTX( Rxd in 11/2016) but was not started. , Folic acid 2mg  po qd - Plan:  CBC with Differential/Platelet, COMPLETE METABOLIC PANEL WITH GFR, CBC with Differential/Platelet, COMPLETE METABOLIC PANEL WITH GFR today and every 3 months.  Fibromyalgia: She continues to have some generalized pain and discomfort.  Other psoriasis - diagnosed by dermatologist. I've advised her to forward records to [place].  Patient reports inflammation in her eye and she's been seeing an ophthalmologist. I've advised her to forward those records to his as well.  DDD (degenerative disc disease), lumbar - S/P fusion  Current smoker: Smoking cessation was discussed.  History of hypothyroidism  History of shingles - 2015     Orders: Orders Placed This Encounter  Procedures  . CBC with Differential/Platelet  . COMPLETE METABOLIC PANEL WITH GFR  . CBC with Differential/Platelet  . COMPLETE METABOLIC PANEL WITH GFR   Meds ordered this encounter  Medications  . folic acid (FOLVITE) 1 MG tablet    Sig: Take 2 tablets (2 mg total) by mouth daily.    Dispense:  180 tablet    Refill:  3    Face-to-face time spent with patient was 30 minutes. Greater than 50% of time was spent in counseling and coordination of care.  Follow-Up Instructions: Return in about 3 months (around 11/20/2017) for Rheumatoid arthritis.   Pollyann Savoy, MD  Note - This record has been created using Animal nutritionist.  Chart creation errors have been sought, but may not always  have been located. Such creation errors do not reflect on  the standard of medical care.

## 2017-08-21 ENCOUNTER — Telehealth: Payer: Self-pay | Admitting: Radiology

## 2017-08-21 ENCOUNTER — Encounter: Payer: Self-pay | Admitting: Rheumatology

## 2017-08-21 ENCOUNTER — Ambulatory Visit (INDEPENDENT_AMBULATORY_CARE_PROVIDER_SITE_OTHER): Payer: BLUE CROSS/BLUE SHIELD | Admitting: Rheumatology

## 2017-08-21 VITALS — BP 130/78 | HR 84 | Resp 16 | Ht 62.0 in | Wt 195.0 lb

## 2017-08-21 DIAGNOSIS — L408 Other psoriasis: Secondary | ICD-10-CM

## 2017-08-21 DIAGNOSIS — F17200 Nicotine dependence, unspecified, uncomplicated: Secondary | ICD-10-CM | POA: Diagnosis not present

## 2017-08-21 DIAGNOSIS — Z8619 Personal history of other infectious and parasitic diseases: Secondary | ICD-10-CM

## 2017-08-21 DIAGNOSIS — M059 Rheumatoid arthritis with rheumatoid factor, unspecified: Secondary | ICD-10-CM

## 2017-08-21 DIAGNOSIS — M5136 Other intervertebral disc degeneration, lumbar region: Secondary | ICD-10-CM

## 2017-08-21 DIAGNOSIS — Z79899 Other long term (current) drug therapy: Secondary | ICD-10-CM | POA: Diagnosis not present

## 2017-08-21 DIAGNOSIS — Z8639 Personal history of other endocrine, nutritional and metabolic disease: Secondary | ICD-10-CM | POA: Diagnosis not present

## 2017-08-21 DIAGNOSIS — M797 Fibromyalgia: Secondary | ICD-10-CM | POA: Diagnosis not present

## 2017-08-21 MED ORDER — FOLIC ACID 1 MG PO TABS: 2.0000 mg | ORAL_TABLET | Freq: Every day | ORAL | 3 refills | Status: DC

## 2017-08-21 NOTE — Telephone Encounter (Signed)
Sticky note made in patient's chart 

## 2017-08-21 NOTE — Patient Instructions (Addendum)
Standing Labs We placed an order today for your standing lab work.    Please come back and get your standing labs in 2 weeks after you start the Methotrexate then two months, then labs will be every 3 months.   We have open lab Monday through Friday from 8:30-11:30 AM and 1:30-4 PM at the office of Dr. Pollyann Savoy.   The office is located at [address], [address], [place], [place] [address] No appointment is necessary.   Labs are drawn by [facility].  You may receive a bill from [facility] for your lab work. If you have any questions regarding directions or hours of operation,  please call [phone].     The folic acid was sent in for you today, you can use 1 mg instead of 2 mg, since you will be on a lower dose of methotrexate than you were previously  Our fax number is [phone] you can have the Dermatologist refax the information to [place] again   Methotrexate tablets What is this medicine? METHOTREXATE (METH oh TREX ate) is a chemotherapy drug used to treat cancer including breast cancer, leukemia, and lymphoma. This medicine can also be used to treat psoriasis and certain kinds of arthritis. This medicine may be used for other purposes; ask your health care provider or pharmacist if you have questions. COMMON BRAND NAME(S): Rheumatrex, Trexall What should I tell my health care provider before I take this medicine? They need to know if you have any of these conditions: -fluid in the stomach area or lungs -if you often drink alcohol -infection or immune system problems -kidney disease or on hemodialysis -liver disease -low blood counts, like low white cell, platelet, or red cell counts -lung disease -radiation therapy -stomach ulcers -ulcerative colitis -an unusual or allergic reaction to methotrexate, other medicines, foods, dyes, or preservatives -pregnant or trying to get pregnant -breast-feeding How should I use this medicine? Take this medicine by mouth with a glass  of water. Follow the directions on the prescription label. Take your medicine at regular intervals. Do not take it more often than directed. Do not stop taking except on your doctor's advice. Make sure you know why you are taking this medicine and how often you should take it. If this medicine is used for a condition that is not cancer, like arthritis or psoriasis, it should be taken weekly, NOT daily. Taking this medicine more often than directed can cause serious side effects, even death. Talk to your healthcare provider about safe handling and disposal of this medicine. You may need to take special precautions. Talk to your pediatrician regarding the use of this medicine in children. While this drug may be prescribed for selected conditions, precautions do apply. Overdosage: If you think you have taken too much of this medicine contact a poison control center or emergency room at once. NOTE: This medicine is only for you. Do not share this medicine with others. What if I miss a dose? If you miss a dose, talk with your doctor or health care professional. Do not take double or extra doses. What may interact with this medicine? This medicine may interact with the following medication: -acitretin -aspirin and aspirin-like medicines including salicylates -azathioprine -certain antibiotics like penicillins, tetracycline, and chloramphenicol -cyclosporine -gold -hydroxychloroquine -live virus vaccines -NSAIDs, medicines for pain and inflammation, like ibuprofen or naproxen -other cytotoxic agents -penicillamine -phenylbutazone -phenytoin -probenecid -retinoids such as isotretinoin and tretinoin -steroid medicines like prednisone or cortisone -sulfonamides like sulfasalazine and trimethoprim/sulfamethoxazole -  theophylline This list may not describe all possible interactions. Give your health care provider a list of all the medicines, herbs, non-prescription drugs, or dietary supplements you  use. Also tell them if you smoke, drink alcohol, or use illegal drugs. Some items may interact with your medicine. What should I watch for while using this medicine? Avoid alcoholic drinks. This medicine can make you more sensitive to the sun. Keep out of the sun. If you cannot avoid being in the sun, wear protective clothing and use sunscreen. Do not use sun lamps or tanning beds/booths. You may need blood work done while you are taking this medicine. Call your doctor or health care professional for advice if you get a fever, chills or sore throat, or other symptoms of a cold or flu. Do not treat yourself. This drug decreases your body's ability to fight infections. Try to avoid being around people who are sick. This medicine may increase your risk to bruise or bleed. Call your doctor or health care professional if you notice any unusual bleeding. Check with your doctor or health care professional if you get an attack of severe diarrhea, nausea and vomiting, or if you sweat a lot. The loss of too much body fluid can make it dangerous for you to take this medicine. Talk to your doctor about your risk of cancer. You may be more at risk for certain types of cancers if you take this medicine. Both men and women must use effective birth control with this medicine. Do not become pregnant while taking this medicine or until at least 1 normal menstrual cycle has occurred after stopping it. Women should inform their doctor if they wish to become pregnant or think they might be pregnant. Men should not father a child while taking this medicine and for 3 months after stopping it. There is a potential for serious side effects to an unborn child. Talk to your health care professional or pharmacist for more information. Do not breast-feed an infant while taking this medicine. What side effects may I notice from receiving this medicine? Side effects that you should report to your doctor or health care professional as  soon as possible: -allergic reactions like skin rash, itching or hives, swelling of the face, lips, or tongue -breathing problems or shortness of breath -diarrhea -dry, nonproductive cough -low blood counts - this medicine may decrease the number of white blood cells, red blood cells and platelets. You may be at increased risk for infections and bleeding. -mouth sores -redness, blistering, peeling or loosening of the skin, including inside the mouth -signs of infection - fever or chills, cough, sore throat, pain or trouble passing urine -signs and symptoms of bleeding such as bloody or black, tarry stools; red or dark-brown urine; spitting up blood or brown material that looks like coffee grounds; red spots on the skin; unusual bruising or bleeding from the eye, gums, or nose -signs and symptoms of kidney injury like trouble passing urine or change in the amount of urine -signs and symptoms of liver injury like dark yellow or brown urine; general ill feeling or flu-like symptoms; light-colored stools; loss of appetite; nausea; right upper belly pain; unusually weak or tired; yellowing of the eyes or skin Side effects that usually do not require medical attention (report to your doctor or health care professional if they continue or are bothersome): -dizziness -hair loss -tiredness -upset stomach -vomiting This list may not describe all possible side effects. Call your doctor for medical advice about  side effects. You may report side effects to FDA at 1-800-FDA-1088. Where should I keep my medicine? Keep out of the reach of children. Store at room temperature between 20 and 25 degrees C (68 and 77 degrees F). Protect from light. Throw away any unused medicine after the expiration date. NOTE: This sheet is a summary. It may not cover all possible information. If you have questions about this medicine, talk to your doctor, pharmacist, or health care provider.  2018 Elsevier/Gold Standard  (2015-07-27 05:39:22)

## 2017-08-21 NOTE — Progress Notes (Signed)
CBC CMP drawn today  TB Gold: negative (06/27/12) Hepatitis panel: negative (06/27/12) HIV: negative (09/23/13)  Chest-xray:  07/12/12  Counseled patient of proper use, storage,  and dosing of Methotrexate.  Counseled patient on avoiding live vaccines, discontinuing methotrexate for surgical procedures, and any time patient is placed on Antibiotics. Also counseled on the importance of lab monitor every 3 months.  Patient consented to Methotrexate. Will upload consent in the media tab. Standing orders placed for lab monitor all questions were encouraged and answered.

## 2017-08-21 NOTE — Telephone Encounter (Signed)
If CBC CMP normal, drawn today, plan is to restart the MTX tablets, Dr Corliss Skains has stated 6 tablets once per week, I have already sent in the folic Acid. Thanks

## 2017-08-22 ENCOUNTER — Telehealth: Payer: Self-pay | Admitting: *Deleted

## 2017-08-22 LAB — CBC WITH DIFFERENTIAL/PLATELET
Basophils Absolute: 82 cells/uL (ref 0–200)
Basophils Relative: 1 %
Eosinophils Absolute: 271 cells/uL (ref 15–500)
Eosinophils Relative: 3.3 %
HCT: 42.8 % (ref 35.0–45.0)
Hemoglobin: 14.1 g/dL (ref 11.7–15.5)
Lymphs Abs: 3050 cells/uL (ref 850–3900)
MCH: 28.3 pg (ref 27.0–33.0)
MCHC: 32.9 g/dL (ref 32.0–36.0)
MCV: 85.9 fL (ref 80.0–100.0)
MPV: 11.6 fL (ref 7.5–12.5)
Monocytes Relative: 4.5 %
Neutro Abs: 4428 cells/uL (ref 1500–7800)
Neutrophils Relative %: 54 %
Platelets: 271 10*3/uL (ref 140–400)
RBC: 4.98 10*6/uL (ref 3.80–5.10)
RDW: 12.8 % (ref 11.0–15.0)
Total Lymphocyte: 37.2 %
WBC mixed population: 369 cells/uL (ref 200–950)
WBC: 8.2 10*3/uL (ref 3.8–10.8)

## 2017-08-22 LAB — COMPLETE METABOLIC PANEL WITH GFR
AG Ratio: 1.4 (calc) (ref 1.0–2.5)
ALT: 26 U/L (ref 6–29)
AST: 24 U/L (ref 10–35)
Albumin: 4.2 g/dL (ref 3.6–5.1)
Alkaline phosphatase (APISO): 104 U/L (ref 33–130)
BUN: 12 mg/dL (ref 7–25)
CO2: 26 mmol/L (ref 20–32)
Calcium: 9.6 mg/dL (ref 8.6–10.4)
Chloride: 105 mmol/L (ref 98–110)
Creat: 0.64 mg/dL (ref 0.50–1.05)
GFR, Est African American: 119 mL/min/{1.73_m2} (ref >=60)
GFR, Est Non African American: 103 mL/min/{1.73_m2} (ref >=60)
Globulin: 2.9 g/dL (calc) (ref 1.9–3.7)
Glucose, Bld: 100 mg/dL — ABNORMAL HIGH (ref 65–99)
Potassium: 4.8 mmol/L (ref 3.5–5.3)
Sodium: 140 mmol/L (ref 135–146)
Total Bilirubin: 0.4 mg/dL (ref 0.2–1.2)
Total Protein: 7.1 g/dL (ref 6.1–8.1)

## 2017-08-22 MED ORDER — METHOTREXATE 2.5 MG PO TABS: 15.0000 mg | ORAL_TABLET | ORAL | 2 refills | Status: DC

## 2017-08-22 NOTE — Telephone Encounter (Signed)
-----   Message from Pollyann Savoy, MD sent at 08/22/2017  8:53 AM EDT ----- WNLs.please check if patient needs to increase her methotrexate dose. She may need monitoring of labs while increasing dose.

## 2017-08-22 NOTE — Progress Notes (Signed)
WNLs.please check if patient needs to increase her methotrexate dose. She may need monitoring of labs while increasing dose.

## 2017-09-04 ENCOUNTER — Telehealth: Payer: Self-pay | Admitting: Rheumatology

## 2017-09-04 NOTE — Telephone Encounter (Signed)
Patient started MTX yesterday. Hand with Psoitis is aching so bad today. Patient request an rx for something to be called in for pain. Patient uses [facility] in [place].

## 2017-09-06 NOTE — Telephone Encounter (Signed)
Pred 5mg  4x2d, 3x2d, 2x2d,1x2d,1/2x2d.

## 2017-09-06 NOTE — Telephone Encounter (Signed)
Patient states she has started the MTX. Patient states she is having pain in her right hand and went up her arm into her shoulder. Patient states the psoriasis has flared on the back of her hand. Patient states the pain feels more like a RA flare. Patient states the pain has dulled. Patient would like to have something to help with with the pain. Please advise.

## 2017-09-06 NOTE — Telephone Encounter (Signed)
Attempted to contact the patient and left message for patient to call the office.  

## 2017-09-07 MED ORDER — PREDNISONE 5 MG PO TABS: ORAL_TABLET | ORAL | 0 refills | Status: DC

## 2017-09-07 NOTE — Telephone Encounter (Signed)
Prescription sent to the pharmacy and patient advised.  

## 2017-11-10 ENCOUNTER — Other Ambulatory Visit: Payer: Self-pay

## 2017-11-10 ENCOUNTER — Encounter (HOSPITAL_COMMUNITY): Payer: Self-pay | Admitting: Emergency Medicine

## 2017-11-10 ENCOUNTER — Emergency Department (HOSPITAL_COMMUNITY)
Admission: EM | Admit: 2017-11-10 | Discharge: 2017-11-10 | Disposition: A | Discharge status: Home / Self Care | Payer: BLUE CROSS/BLUE SHIELD | Attending: Emergency Medicine | Admitting: Emergency Medicine

## 2017-11-10 DIAGNOSIS — M79604 Pain in right leg: Secondary | ICD-10-CM | POA: Diagnosis not present

## 2017-11-10 DIAGNOSIS — M069 Rheumatoid arthritis, unspecified: Secondary | ICD-10-CM | POA: Insufficient documentation

## 2017-11-10 DIAGNOSIS — E039 Hypothyroidism, unspecified: Secondary | ICD-10-CM | POA: Diagnosis not present

## 2017-11-10 DIAGNOSIS — F17210 Nicotine dependence, cigarettes, uncomplicated: Secondary | ICD-10-CM | POA: Diagnosis not present

## 2017-11-10 MED ORDER — ENOXAPARIN SODIUM 100 MG/ML ~~LOC~~ SOLN
1.0000 mg/kg | Freq: Once | SUBCUTANEOUS | Status: AC
  Administered 2017-11-10: 90 mg via SUBCUTANEOUS
  Filled 2017-11-10: qty 1

## 2017-11-10 NOTE — Discharge Instructions (Signed)
As discussed, you have an appt scheduled for tomorrow morning at 9:00 am to have an ultrasound of your right leg.  Please arrive 15 minutes early to register.  If your results are negative, call your primary doctor to arrange a follow-up appt.  You have been given a blood thinner tonight, so be careful to avoid falling or cutting yourself

## 2017-11-10 NOTE — ED Triage Notes (Signed)
Knot top of rt upper leg with pain and swelling x 1 week.  Pt also reports numbness from rt hip to knee x several months

## 2017-11-10 NOTE — ED Notes (Signed)
Pt called back to room X 1 no answer

## 2017-11-10 NOTE — ED Provider Notes (Signed)
[facility] EMERGENCY DEPARTMENT Provider Note   CSN: [id number] Arrival date & time: 11/10/17  8546     History   Chief Complaint Chief Complaint  Patient presents with  . Leg Pain    HPI Amy Mccarthy is a 51 y.o. female.  HPI   Amy Mccarthy is a 51 y.o. female who presents to the Emergency Department complaining of right thigh burning, numbness and tingling sensation for 2-3 months.  Recently, she noticed swelling and pain to the lower anterior thigh, swelling feels "like a knot".  Pain is constant and worse at night when lying on the right side.  Admits to back pain.  She was seen today at a local urgent care and advised to come to ER for further evaluation for a DVT.  Patient denies redness, recent injury, calf pain or swelling.  She is a smoker.  No hx of previous DVT/PE, recent extended travel or hormone therapy.      Past Medical History:  Diagnosis Date  . Arthritis   . Rheumatoid arthritis(714.0) 8/13  . Right shoulder pain   . Shoulder pain, left   . Thyroid disease     Patient Active Problem List   Diagnosis Date Noted  . Rheumatoid arthritis 11/15/2016  . High risk medication use 11/15/2016  . Fibromyalgia 11/15/2016  . Psoriasis 11/15/2016  . 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 Smoker 11/15/2016  . Hypothyroidism 11/15/2016    Past Surgical History:  Procedure Laterality Date  . CERVICAL DISC SURGERY      OB History    No data available       Home Medications    Prior to Admission medications   Not on File    Family History Family History  Problem Relation Age of Onset  . Hypothyroidism Mother   . Hypothyroidism Maternal Grandmother     Social History Social History   Tobacco Use  . Smoking status: Current Every Day Smoker    Packs/day: 1.00    Types: Cigarettes    Start date: 09/04/1981  . Smokeless tobacco: Never Used  Substance Use Topics  . Alcohol use: No  . Drug use: No     Allergies   Patient  has no known allergies.   Review of Systems Review of Systems  Constitutional: Negative for chills and fever.  Respiratory: Negative for shortness of breath.   Cardiovascular: Negative for chest pain.  Genitourinary: Negative for difficulty urinating and dysuria.  Musculoskeletal: Positive for myalgias (right upper leg pain). Negative for arthralgias and joint swelling.  Skin: Negative for color change and wound.  Neurological: Positive for numbness (numbness and tingling to right upper leg). Negative for weakness.  All other systems reviewed and are negative.    Physical Exam Updated Vital Signs BP (!) 120/56 (BP Location: Right Arm)   Pulse 75   Temp 98.4 F (36.9 C) (Oral)   Resp 17   Ht 5\' 2"  (1.575 m)   Wt 88.5 kg (195 lb)   SpO2 95%   BMI 35.67 kg/m   Physical Exam  Constitutional: She is oriented to person, place, and time. She appears well-developed and well-nourished. No distress.  HENT:  Mouth/Throat: Oropharynx is clear and moist.  Cardiovascular: Normal rate, regular rhythm, normal heart sounds and intact distal pulses.  No murmur heard. Palpable DP pulses bilaterally  Pulmonary/Chest: Effort normal and breath sounds normal. No respiratory distress.  Abdominal: Soft.  Musculoskeletal: Normal range of motion. She exhibits  tenderness.  Focal tenderness to anterior aspect of the distal right upper leg.  No excessive warmth, erythema or edema.  Negative Homan's sign.  Calf is non-tender.    Neurological: She is alert and oriented to person, place, and time. No sensory deficit.  Skin: Skin is warm. Capillary refill takes less than 2 seconds. No rash noted. No erythema.  Psychiatric: She has a normal mood and affect.  Nursing note and vitals reviewed.    ED Treatments / Results  Labs (all labs ordered are listed, but only abnormal results are displayed) Labs Reviewed - No data to display  EKG  EKG Interpretation None       Radiology No results  found.  Procedures Procedures (including critical care time)  Medications Ordered in ED Medications  enoxaparin (LOVENOX) injection 90 mg (90 mg Subcutaneous Given 11/10/17 2132)     Initial Impression / Assessment and Plan / ED Course  I have reviewed the triage vital signs and the nursing notes.  Pertinent labs & imaging results that were available during my care of the patient were reviewed by me and considered in my medical decision making (see chart for details).    Labs from 9/18 reviewed by me.     Pt is well appearing. NV intact.  No concerning sx's for cellulitis.  No after hours [place] available.  Pt agrees to [facility] Lovenox tonight and to return here in am for venous US.  I have scheduled test for 9:00 am tomorrow (11/11/17)  Final Clinical Impressions(s) / ED Diagnoses   Final diagnoses:  Right leg pain    ED Discharge Orders        Ordered    US Venous Img Lower Unilateral Right     11/10/17 2125       Pauline Aus, PA-C 11/11/17 0124    Raeford Razor, MD 11/11/17 2153

## 2017-11-11 ENCOUNTER — Ambulatory Visit (HOSPITAL_COMMUNITY)
Admit: 2017-11-11 | Discharge: 2017-11-11 | Disposition: A | Discharge status: Home / Self Care | Payer: BLUE CROSS/BLUE SHIELD | Attending: Emergency Medicine | Admitting: Emergency Medicine

## 2017-11-11 DIAGNOSIS — M7989 Other specified soft tissue disorders: Secondary | ICD-10-CM | POA: Diagnosis not present

## 2017-11-11 DIAGNOSIS — M79651 Pain in right thigh: Secondary | ICD-10-CM | POA: Insufficient documentation

## 2017-11-21 NOTE — Progress Notes (Deleted)
Office Visit Note  Patient: Amy Mccarthy             Date of Birth: 1966-03-31           MRN: [id number]             PCP: Elfredia Nevins, MD Referring: Elfredia Nevins, MD Visit Date: 12/04/2017 Occupation: @GUAROCC @    Subjective:  No chief complaint on file.   History of Present Illness: Amy Mccarthy is a 51 y.o. female ***   Activities of Daily Living:  Patient reports morning stiffness for *** {minute/hour:19697}.   Patient {ACTIONS;DENIES/REPORTS:21021675::"Denies"} nocturnal pain.  Difficulty dressing/grooming: {ACTIONS;DENIES/REPORTS:21021675::"Denies"} Difficulty climbing stairs: {ACTIONS;DENIES/REPORTS:21021675::"Denies"} Difficulty getting out of chair: {ACTIONS;DENIES/REPORTS:21021675::"Denies"} Difficulty using hands for taps, buttons, cutlery, and/or writing: {ACTIONS;DENIES/REPORTS:21021675::"Denies"}   No Rheumatology ROS completed.   PMFS History:  Patient Active Problem List   Diagnosis Date Noted  . Rheumatoid arthritis 11/15/2016  . High risk medication use 11/15/2016  . Fibromyalgia 11/15/2016  . Psoriasis 11/15/2016  . 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 Smoker 11/15/2016  . Hypothyroidism 11/15/2016    Past Medical History:  Diagnosis Date  . Arthritis   . Rheumatoid arthritis(714.0) 8/13  . Right shoulder pain   . Shoulder pain, left   . Thyroid disease     Family History  Problem Relation Age of Onset  . Hypothyroidism Mother   . Hypothyroidism Maternal Grandmother    Past Surgical History:  Procedure Laterality Date  . CERVICAL DISC SURGERY     Social History   Social History Narrative  . Not on file     Objective: Vital Signs: There were no vitals taken for this visit.   Physical Exam   Musculoskeletal Exam: ***  CDAI Exam: No CDAI exam completed.    Investigation: No additional findings. CBC Latest Ref Rng & Units 08/21/2017 08/27/2011 01/20/2011  WBC 3.8 - 10.8 Thousand/uL 8.2 12.1(H) 9.2    Hemoglobin 11.7 - 15.5 g/dL 01/22/2011 40.0 86.7  Hematocrit 35.0 - 45.0 % 42.8 38.9 36.4  Platelets 140 - 400 Thousand/uL 271 268 276   CMP Latest Ref Rng & Units 08/21/2017 11/14/2014 08/27/2011  Glucose 65 - 99 mg/dL 08/29/2011) 509(T) 91  BUN 7 - 25 mg/dL 12 21 7   Creatinine 0.50 - 1.05 mg/dL 267(T 2.45  Sodium 135 - 146 mmol/L 140 142 137  Potassium 3.5 - 5.3 mmol/L 4.8 4.5 3.7  Chloride 98 - 110 mmol/L 105 101 101  CO2 20 - 32 mmol/L 26 27 26   Calcium 8.6 - 10.4 mg/dL 9.6 9.6 9.2  Total Protein 6.1 - 8.1 g/dL 7.1 - 7.0  Total Bilirubin 0.2 - 1.2 mg/dL 0.4 - 0.5  Alkaline Phos 39 - 117 U/L - - 106  AST 10 - 35 U/L 24 - 16  ALT 6 - 29 U/L 26 - 17    Imaging: 8.09 Venous Img Lower Unilateral Right  Result Date: 11/11/2017 CLINICAL DATA:  Pain and edema x1 week EXAM: RIGHT LOWER EXTREMITY VENOUS DOPPLER ULTRASOUND TECHNIQUE: Gray-scale sonography with compression, as well as color and duplex ultrasound, were performed to evaluate the deep venous system from the level of the common femoral vein through the popliteal and proximal calf veins. COMPARISON:  None FINDINGS: Normal compressibility of the common femoral, superficial femoral, and popliteal veins, as well as the proximal calf veins. No filling defects to suggest DVT on grayscale or color Doppler imaging. Doppler waveforms show normal direction of venous flow, normal respiratory  phasicity and response to augmentation. Visualized segments of the saphenous venous system normal in caliber and compressibility. Survey views of the contralateral common femoral vein are unremarkable. IMPRESSION: No evidence of  lower extremity deep vein thrombosis, right. Electronically Signed   By: Corlis Leak M.D.   On: 11/11/2017 09:33    Speciality Comments: No specialty comments available.    Procedures:  No procedures performed Allergies: Patient has no known allergies.   Assessment / Plan:     Visit Diagnoses: No diagnosis found.    Orders: No  orders of the defined types were placed in this encounter.  No orders of the defined types were placed in this encounter.   Face-to-face time spent with patient was *** minutes. 50% of time was spent in counseling and coordination of care.  Follow-Up Instructions: No Follow-up on file.   Ellen Henri, CMA  Note - This record has been created using Animal nutritionist.  Chart creation errors have been sought, but may not always  have been located. Such creation errors do not reflect on  the standard of medical care.

## 2017-12-04 ENCOUNTER — Ambulatory Visit: Payer: BLUE CROSS/BLUE SHIELD | Admitting: Rheumatology

## 2018-12-03 ENCOUNTER — Other Ambulatory Visit (HOSPITAL_COMMUNITY): Payer: Self-pay | Admitting: Internal Medicine

## 2018-12-03 ENCOUNTER — Ambulatory Visit (HOSPITAL_COMMUNITY)
Admission: RE | Admit: 2018-12-03 | Discharge: 2018-12-03 | Disposition: A | Discharge status: Home / Self Care | Payer: BLUE CROSS/BLUE SHIELD | Source: Ambulatory Visit | Attending: Internal Medicine | Admitting: Internal Medicine

## 2018-12-03 DIAGNOSIS — M545 Low back pain, unspecified: Secondary | ICD-10-CM

## 2019-04-11 ENCOUNTER — Ambulatory Visit (INDEPENDENT_AMBULATORY_CARE_PROVIDER_SITE_OTHER): Payer: BLUE CROSS/BLUE SHIELD | Admitting: Otolaryngology

## 2019-04-11 DIAGNOSIS — R42 Dizziness and giddiness: Secondary | ICD-10-CM | POA: Diagnosis not present

## 2019-04-11 DIAGNOSIS — J342 Deviated nasal septum: Secondary | ICD-10-CM

## 2019-04-11 DIAGNOSIS — J320 Chronic maxillary sinusitis: Secondary | ICD-10-CM | POA: Diagnosis not present

## 2019-04-11 DIAGNOSIS — J310 Chronic rhinitis: Secondary | ICD-10-CM | POA: Diagnosis not present

## 2019-04-11 DIAGNOSIS — H903 Sensorineural hearing loss, bilateral: Secondary | ICD-10-CM

## 2019-04-15 NOTE — Progress Notes (Signed)
Office Visit Note  Patient: Amy Mccarthy             Date of Birth: 05-16-1966           MRN: [id number]             PCP: Elfredia Nevins, MD Referring: Elfredia Nevins, MD Visit Date: 04/19/2019 Occupation: @  Subjective:  Pain in multiple joints   History of Present Illness: Amy Mccarthy is a 53 y.o. female with history of seropositive rheumatoid arthritis, fibromyalgia, and DDD.  She has not been evaluated since 08/21/17.  She was previously on MTX in 2017.  She has been having more flares of psoriasis and eczema recently. She has noticed more nail pitting. She is followed by a dermatologist. She is having increased pain in bilateral SI joints, both hands, and both feet. She has been having swelling in both hands and both feet. She works as a Interior and spatial designer. She is currently taking prednisone taper.    Activities of Daily Living:  Patient reports morning stiffness for 30 minutes.   Patient Reports nocturnal pain.  Difficulty dressing/grooming: Denies Difficulty climbing stairs: Reports Difficulty getting out of chair: Reports Difficulty using hands for taps, buttons, cutlery, and/or writing: Reports  Review of Systems  Constitutional: Negative for fatigue.  HENT: Negative for mouth sores, mouth dryness and nose dryness.   Eyes: Negative for pain, visual disturbance and dryness.  Respiratory: Negative for cough, hemoptysis, shortness of breath and difficulty breathing.   Cardiovascular: Negative for chest pain, palpitations, hypertension and swelling in legs/feet.  Gastrointestinal: Negative for blood in stool, constipation and diarrhea.  Endocrine: Negative for increased urination.  Genitourinary: Negative for painful urination.  Musculoskeletal: Positive for arthralgias, joint pain, joint swelling and morning stiffness. Negative for myalgias, muscle weakness, muscle tenderness and myalgias.  Skin: Negative for color change, pallor, rash, hair loss, nodules/bumps, skin  tightness, ulcers and sensitivity to sunlight.  Allergic/Immunologic: Negative for susceptible to infections.  Neurological: Negative for dizziness, numbness, headaches and weakness.  Hematological: Negative for swollen glands.  Psychiatric/Behavioral: Negative for depressed mood and sleep disturbance. The patient is not nervous/anxious.     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   Objective: Vital Signs: BP 128/67 (BP Location: Right Arm, Patient Position: Sitting, Cuff Size: Small)    Pulse 66    Resp 12    Ht  (1.575 m)    Wt 219 lb (99.3 kg)    BMI 40.06 kg/m    Physical Exam Vitals signs and nursing note reviewed.  Constitutional:      Appearance: She is well-developed.  HENT:     Head: Normocephalic and  atraumatic.  Eyes:     Conjunctiva/sclera: Conjunctivae normal.  Neck:     Musculoskeletal: Normal range of motion.  Cardiovascular:     Rate and Rhythm: Normal rate and regular rhythm.     Heart sounds: Normal heart sounds.  Pulmonary:     Effort: Pulmonary effort is normal.     Breath sounds: Normal breath sounds.  Abdominal:     General: Bowel sounds are normal.     Palpations: Abdomen is soft.  Lymphadenopathy:      Cervical: No cervical adenopathy.  Skin:    General: Skin is warm and dry.     Capillary Refill: Capillary refill takes less than 2 seconds.     Comments: Dry skin was noted on her hands.  No psoriasis patches were noted.  Nail pitting was noted.  Neurological:     Mental Status: She is alert and oriented to person, place, and time.  Psychiatric:        Behavior: Behavior normal.      Musculoskeletal Exam: C-spine, thoracic spine, and lumbar spine good ROM.  No midline spinal tenderness  Bilateral SI joint tenderness. Shoulder joints, elbow joints, wrist joints, MCPs, PIPs, and DIPs good ROM with no synovitis.  Hip joints, knee joints, ankle joints, MTPs, PIPs, and DIPs good ROM with no synovitis.  No warmth or effusion of knee joints.  No tenderness or swelling of ankle joints.  Nail pitting noted.   CDAI Exam: CDAI Score: Not documented Patient Global Assessment: Not documented; Provider Global Assessment: Not documented Swollen: Not documented; Tender: Not documented Joint Exam   Not documented   There is currently no information documented on the homunculus. Go to the Rheumatology activity and complete the homunculus joint exam.  Investigation: No additional findings.  Imaging: No results found.  Recent Labs: Lab Results  Component Value Date   WBC 8.2 08/21/2017   HGB 14.1 08/21/2017   PLT 271 08/21/2017   NA 140 08/21/2017   K 4.8 08/21/2017   CL 105 08/21/2017   CO2 26 08/21/2017   GLUCOSE 100 (H) 08/21/2017   BUN 12 08/21/2017   CREATININE 0.64 08/21/2017   BILITOT 0.4 08/21/2017   ALKPHOS 106 08/27/2011   AST 24 08/21/2017   ALT 26 08/21/2017   PROT 7.1 08/21/2017   ALBUMIN 3.9 08/27/2011   CALCIUM 9.6 08/21/2017   GFRAA 119 08/21/2017    Speciality Comments: No specialty comments available.  Procedures:  No procedures performed Allergies: Patient has no known allergies.       Assessment / Plan:     Visit Diagnoses: Rheumatoid arthritis with  positive rheumatoid factor, involving unspecified site (HCC) - Positive rheumatoid factor, positive CCP, positive ANA, +14 33 eta treated with methotrexate in the past: She was last seen in the office on 08/21/2017.  She has not been on any treatment for arthritis since then.  She has been having increased pain and swelling in bilateral hands and bilateral feet.  She has bilateral SI joint tenderness on exam today.  She is currently on a prednisone taper so no joint inflammation was noted on exam.  X-rays of both hands, both feet, and the pelvis were obtained today.  She would like to restart methotrexate.  Indications, contraindications, potential side effects of methotrexate were discussed.  All questions were addressed and consent was obtained today.  She is due to update lab work prior to restarting on methotrexate.  She was advised to notify us if she cannot tolerate taking  methotrexate.  She also notify us if develops increased joint pain or joint swelling.  She will follow-up in the office in 3 months.  Drug Counseling TB Gold: Pending  Hepatitis panel: Pending  HIV negative 09/23/13 Immunoglobulins WNL 09/23/13 Chest-xray:  8.07/2012 no acute findings  Contraception: She is postmenopausal  Alcohol use: Discussed the importance of avoiding alcohol use  Patient was counseled on the purpose, proper use, and adverse effects of methotrexate including nausea, infection, and signs and symptoms of pneumonitis.  Reviewed instructions with patient to take methotrexate weekly along with folic acid daily.  Discussed the importance of frequent monitoring of kidney and liver function and blood counts, and provided patient with standing lab instructions.  Counseled patient to avoid NSAIDs and alcohol while on methotrexate.  Provided patient with educational materials on methotrexate and answered all questions.  Advised patient to get annual influenza vaccine and to get a pneumococcal vaccine if patient has not  already had one.  Patient voiced understanding.  Patient consented to methotrexate use.  Will upload into chart.    High risk medication use - She was previously treated with MTX in 2017.  She will be restarting on MTX.  She will restart on 6 tablets by mouth once weekly for 2 weeks and return for lab work in 2 weeks.  If labs are stable she will increase to 8 tablets by mouth once weekly.  She will take folic acid 2 mg by mouth daily.  Prescription pending lab results.  Future orders for CBC, CMP, SPEP, TB gold, and hep panel will be placed today.  Pain in both hands -She has been experiencing increased pain in bilateral hands.  No synovitis was noted on exam today.  She is currently on a prednisone taper.  She has been having intermittent joint swelling and recurrent flares.  She will be restarting on methotrexate.  X-rays of both hands were obtained today.  Plan: XR Hand 2 View Right, XR Hand 2 View Left  Pain in both feet -She has been experiencing increased pain in bilateral feet especially in bilateral first MTP joints.  X-rays of both feet were obtained today.  Plan: XR Foot 2 Views Left, XR Foot 2 Views Right  Chronic SI joint pain -She has bilateral SI joint tenderness on exam today.  X-rays of the pelvis were obtained. Plan: XR Pelvis 1-2 Views   Fibromyalgia: She continues have generalized muscle aches muscle tenderness due to fibromyalgia.  She has generalized hyperalgesia on exam today.  She has been having increased myalgias recently.  She has chronic fatigue and insomnia.  Other psoriasis -Diagnosed by dermatologist.  She is scattered patches of psoriasis and eczema.  Nail pitting was noted on exam today.  She will be restarting on methotrexate 6 tablets by mouth once weekly for 2 weeks and then increase to 8 tablets by mouth once weekly if her labs are stable.  DDD (degenerative disc disease), lumbar - S/P fusion: She has no midline spinal tenderness.  She has good range of motion on  exam.  She has bilateral SI joint tenderness on exam today.  Other medical conditions are listed as follows:  History of shingles - 2015.  History of hypothyroidism  Current smoker    Orders: Orders Placed This Encounter  Procedures   XR Foot 2 Views Left   XR Foot 2 Views Right   XR Hand 2 View Right   XR Hand 2 View Left   XR Pelvis 1-2 Views  CBC with Differential/Platelet   COMPLETE METABOLIC PANEL WITH GFR   QuantiFERON-TB Gold Plus   Serum protein electrophoresis with reflex   Hepatitis B core antibody, IgM   Hepatitis C antibody   Hepatitis B surface antigen   No orders of the defined types were placed in this encounter.   Face-to-face time spent with patient was 30 minutes. Greater than 50% of time was spent in counseling and coordination of care.  Follow-Up Instructions: Return in about 3 months (around 07/20/2019) for Rheumatoid arthritis, Fibromyalgia.   Gearldine Bienenstockaylor M Neal Trulson, PA-C   I examined and evaluated the patient with Sherron Alesaylor Kensly Bowmer PA.  Patient had no synovitis on examination.  She states she had recent flare with increased pain and swelling in multiple joints.  She did better on prednisone taper.  She has taken methotrexate in the past and would like to restart on it.  Indication side effects contraindications were reviewed today.  We will obtain labs and then will call in prescription for methotrexate.  The plan of care was discussed as noted above.  Pollyann SavoyShaili Deveshwar, MD  Note - This record has been created using Animal nutritionistDragon software.  Chart creation errors have been sought, but may not always  have been located. Such creation errors do not reflect on  the standard of medical care.

## 2019-04-19 ENCOUNTER — Other Ambulatory Visit: Payer: Self-pay

## 2019-04-19 ENCOUNTER — Ambulatory Visit: Payer: Self-pay

## 2019-04-19 ENCOUNTER — Other Ambulatory Visit (HOSPITAL_COMMUNITY): Payer: Self-pay | Admitting: Otolaryngology

## 2019-04-19 ENCOUNTER — Ambulatory Visit (INDEPENDENT_AMBULATORY_CARE_PROVIDER_SITE_OTHER): Payer: BLUE CROSS/BLUE SHIELD

## 2019-04-19 ENCOUNTER — Telehealth: Payer: Self-pay | Admitting: *Deleted

## 2019-04-19 ENCOUNTER — Encounter: Payer: Self-pay | Admitting: Rheumatology

## 2019-04-19 ENCOUNTER — Ambulatory Visit: Payer: BLUE CROSS/BLUE SHIELD | Admitting: Rheumatology

## 2019-04-19 ENCOUNTER — Other Ambulatory Visit: Payer: Self-pay | Admitting: Otolaryngology

## 2019-04-19 VITALS — BP 128/67 | HR 66 | Resp 12 | Ht 62.0 in | Wt 219.0 lb

## 2019-04-19 DIAGNOSIS — Z79899 Other long term (current) drug therapy: Secondary | ICD-10-CM | POA: Diagnosis not present

## 2019-04-19 DIAGNOSIS — M79642 Pain in left hand: Secondary | ICD-10-CM

## 2019-04-19 DIAGNOSIS — M5136 Other intervertebral disc degeneration, lumbar region: Secondary | ICD-10-CM | POA: Diagnosis not present

## 2019-04-19 DIAGNOSIS — M79641 Pain in right hand: Secondary | ICD-10-CM

## 2019-04-19 DIAGNOSIS — M79671 Pain in right foot: Secondary | ICD-10-CM

## 2019-04-19 DIAGNOSIS — M059 Rheumatoid arthritis with rheumatoid factor, unspecified: Secondary | ICD-10-CM

## 2019-04-19 DIAGNOSIS — J329 Chronic sinusitis, unspecified: Secondary | ICD-10-CM

## 2019-04-19 DIAGNOSIS — F17200 Nicotine dependence, unspecified, uncomplicated: Secondary | ICD-10-CM

## 2019-04-19 DIAGNOSIS — Z8619 Personal history of other infectious and parasitic diseases: Secondary | ICD-10-CM

## 2019-04-19 DIAGNOSIS — L408 Other psoriasis: Secondary | ICD-10-CM | POA: Diagnosis not present

## 2019-04-19 DIAGNOSIS — M79672 Pain in left foot: Secondary | ICD-10-CM

## 2019-04-19 DIAGNOSIS — Z8639 Personal history of other endocrine, nutritional and metabolic disease: Secondary | ICD-10-CM

## 2019-04-19 DIAGNOSIS — M533 Sacrococcygeal disorders, not elsewhere classified: Secondary | ICD-10-CM

## 2019-04-19 DIAGNOSIS — M797 Fibromyalgia: Secondary | ICD-10-CM

## 2019-04-19 DIAGNOSIS — G8929 Other chronic pain: Secondary | ICD-10-CM

## 2019-04-19 NOTE — Patient Instructions (Signed)
Methotrexate tablets What is this medicine? METHOTREXATE (METH oh TREX ate) is a chemotherapy drug used to treat cancer including breast cancer, leukemia, and lymphoma. This medicine can also be used to treat psoriasis and certain kinds of arthritis. This medicine may be used for other purposes; ask your health care provider or pharmacist if you have questions. COMMON BRAND NAME(S): Rheumatrex, Trexall What should I tell my health care provider before I take this medicine? They need to know if you have any of these conditions: -fluid in the stomach area or lungs -if you often drink alcohol -infection or immune system problems -kidney disease or on hemodialysis -liver disease -low blood counts, like low white cell, platelet, or red cell counts -lung disease -radiation therapy -stomach ulcers -ulcerative colitis -an unusual or allergic reaction to methotrexate, other medicines, foods, dyes, or preservatives -pregnant or trying to get pregnant -breast-feeding How should I use this medicine? Take this medicine by mouth with a glass of water. Follow the directions on the prescription label. Take your medicine at regular intervals. Do not take it more often than directed. Do not stop taking except on your doctor's advice. Make sure you know why you are taking this medicine and how often you should take it. If this medicine is used for a condition that is not cancer, like arthritis or psoriasis, it should be taken weekly, NOT daily. Taking this medicine more often than directed can cause serious side effects, even death. Talk to your healthcare provider about safe handling and disposal of this medicine. You may need to take special precautions. Talk to your pediatrician regarding the use of this medicine in children. While this drug may be prescribed for selected conditions, precautions do apply. Overdosage: If you think you have taken too much of this medicine contact a poison control center or  emergency room at once. NOTE: This medicine is only for you. Do not share this medicine with others. What if I miss a dose? If you miss a dose, talk with your doctor or health care professional. Do not take double or extra doses. What may interact with this medicine? This medicine may interact with the following medication: -acitretin -aspirin and aspirin-like medicines including salicylates -azathioprine -certain antibiotics like penicillins, tetracycline, and chloramphenicol -cyclosporine -gold -hydroxychloroquine -live virus vaccines -NSAIDs, medicines for pain and inflammation, like ibuprofen or naproxen -other cytotoxic agents -penicillamine -phenylbutazone -phenytoin -probenecid -retinoids such as isotretinoin and tretinoin -steroid medicines like prednisone or cortisone -sulfonamides like sulfasalazine and trimethoprim/sulfamethoxazole -theophylline This list may not describe all possible interactions. Give your health care provider a list of all the medicines, herbs, non-prescription drugs, or dietary supplements you use. Also tell them if you smoke, drink alcohol, or use illegal drugs. Some items may interact with your medicine. What should I watch for while using this medicine? Avoid alcoholic drinks. This medicine can make you more sensitive to the sun. Keep out of the sun. If you cannot avoid being in the sun, wear protective clothing and use sunscreen. Do not use sun lamps or tanning beds/booths. You may need blood work done while you are taking this medicine. Call your doctor or health care professional for advice if you get a fever, chills or sore throat, or other symptoms of a cold or flu. Do not treat yourself. This drug decreases your body's ability to fight infections. Try to avoid being around people who are sick. This medicine may increase your risk to bruise or bleed. Call your doctor or health care professional   if you notice any unusual bleeding. Check with your  doctor or health care professional if you get an attack of severe diarrhea, nausea and vomiting, or if you sweat a lot. The loss of too much body fluid can make it dangerous for you to take this medicine. Talk to your doctor about your risk of cancer. You may be more at risk for certain types of cancers if you take this medicine. Both men and women must use effective birth control with this medicine. Do not become pregnant while taking this medicine or until at least 1 normal menstrual cycle has occurred after stopping it. Women should inform their doctor if they wish to become pregnant or think they might be pregnant. Men should not father a child while taking this medicine and for 3 months after stopping it. There is a potential for serious side effects to an unborn child. Talk to your health care professional or pharmacist for more information. Do not breast-feed an infant while taking this medicine. What side effects may I notice from receiving this medicine? Side effects that you should report to your doctor or health care professional as soon as possible: -allergic reactions like skin rash, itching or hives, swelling of the face, lips, or tongue -breathing problems or shortness of breath -diarrhea -dry, nonproductive cough -low blood counts - this medicine may decrease the number of white blood cells, red blood cells and platelets. You may be at increased risk for infections and bleeding. -mouth sores -redness, blistering, peeling or loosening of the skin, including inside the mouth -signs of infection - fever or chills, cough, sore throat, pain or trouble passing urine -signs and symptoms of bleeding such as bloody or black, tarry stools; red or dark-brown urine; spitting up blood or brown material that looks like coffee grounds; red spots on the skin; unusual bruising or bleeding from the eye, gums, or nose -signs and symptoms of kidney injury like trouble passing urine or change in the amount  of urine -signs and symptoms of liver injury like dark yellow or brown urine; general ill feeling or flu-like symptoms; light-colored stools; loss of appetite; nausea; right upper belly pain; unusually weak or tired; yellowing of the eyes or skin Side effects that usually do not require medical attention (report to your doctor or health care professional if they continue or are bothersome): -dizziness -hair loss -tiredness -upset stomach -vomiting This list may not describe all possible side effects. Call your doctor for medical advice about side effects. You may report side effects to FDA at 1-800-FDA-1088. Where should I keep my medicine? Keep out of the reach of children. Store at room temperature between 20 and 25 degrees C (68 and 77 degrees F). Protect from light. Throw away any unused medicine after the expiration date. NOTE: This sheet is a summary. It may not cover all possible information. If you have questions about this medicine, talk to your doctor, pharmacist, or health care provider.  2019 Elsevier/Gold Standard (2017-07-13 13:38:43)  

## 2019-04-19 NOTE — Telephone Encounter (Signed)
Once lab result send in prescription for MTX per office note on 04/19/19   She will restart on 6 tablets by mouth once weekly for 2 weeks and return for lab work in 2 weeks.  If labs are stable she will increase to 8 tablets by mouth once weekly.  She will take folic acid 2 mg by mouth daily.

## 2019-04-25 ENCOUNTER — Other Ambulatory Visit: Payer: Self-pay | Admitting: *Deleted

## 2019-04-25 ENCOUNTER — Telehealth: Payer: Self-pay | Admitting: Rheumatology

## 2019-04-25 DIAGNOSIS — Z79899 Other long term (current) drug therapy: Secondary | ICD-10-CM

## 2019-04-25 NOTE — Telephone Encounter (Signed)
Patient is at [facility] in [place] right now. Please release orders.

## 2019-04-25 NOTE — Telephone Encounter (Signed)
Patient went for labs 04/25/19. Awaiting results.

## 2019-04-25 NOTE — Telephone Encounter (Signed)
Lab orders released.  

## 2019-04-26 MED ORDER — PREDNISONE 5 MG PO TABS: ORAL_TABLET | ORAL | 0 refills | Status: DC

## 2019-04-26 NOTE — Progress Notes (Signed)
ALT is mildly elevated.  Please advise patient to avoid NSAID, tylenol, and alcohol.  Discussed with Dr. Corliss Skains, and she is ok with the patient proceeding with low dose methotrexate 4 tablets po once weekly.  Please advise patient to return 2 weeks after starting on MTX to recheck lab work. CBC WNL

## 2019-04-26 NOTE — Telephone Encounter (Signed)
Per lab note  ALT is mildly elevated. Please advise patient to avoid NSAID, tylenol, and alcohol. Discussed with Dr. Corliss Skains, and she is ok with the patient proceeding with low dose methotrexate 4 tablets po once weekly. Please advise patient to return 2 weeks after starting on MTX to recheck lab work. CBC WNL   TB Gold and Hepatitis test are still pending. Do you want to wait until these result before we send prescription to the pharmacy? Patient is also asking what she may do for pain relief if she gets a headache. Patient states she still having the itching on her hands and her fingers and she has even had a few places that have come up on her face. 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 Please advise.

## 2019-04-26 NOTE — Addendum Note (Signed)
Addended by: Henriette Combs on: 04/26/2019 03:49 PM   Modules accepted: Orders

## 2019-04-26 NOTE — Telephone Encounter (Signed)
Patient advised prescription for Dr. Corliss Skains sending prescription for prednisone to the pharmacy. Patient advised she should PCP for itching and rash. Patient and verbalized understanding.

## 2019-04-26 NOTE — Telephone Encounter (Signed)
YES, can not rx MTX till all the labs are back.Ok to give Pred taper starting at 20 mg po qd and taper by 5 mg q 2 days. She should see her PCP for itching and rash.

## 2019-04-30 LAB — CBC WITH DIFFERENTIAL/PLATELET
Absolute Monocytes: 370 cells/uL (ref 200–950)
Basophils Absolute: 70 cells/uL (ref 0–200)
Basophils Relative: 0.8 %
Eosinophils Absolute: 229 cells/uL (ref 15–500)
Eosinophils Relative: 2.6 %
HCT: 43.7 % (ref 35.0–45.0)
Hemoglobin: 14.6 g/dL (ref 11.7–15.5)
Lymphs Abs: 3326 cells/uL (ref 850–3900)
MCH: 28.6 pg (ref 27.0–33.0)
MCHC: 33.4 g/dL (ref 32.0–36.0)
MCV: 85.7 fL (ref 80.0–100.0)
MPV: 11.6 fL (ref 7.5–12.5)
Monocytes Relative: 4.2 %
Neutro Abs: 4805 cells/uL (ref 1500–7800)
Neutrophils Relative %: 54.6 %
Platelets: 307 10*3/uL (ref 140–400)
RBC: 5.1 10*6/uL (ref 3.80–5.10)
RDW: 13 % (ref 11.0–15.0)
Total Lymphocyte: 37.8 %
WBC: 8.8 10*3/uL (ref 3.8–10.8)

## 2019-04-30 LAB — PROTEIN ELECTROPHORESIS, SERUM, WITH REFLEX
Albumin ELP: 3.9 g/dL (ref 3.8–4.8)
Alpha 1: 0.3 g/dL (ref 0.2–0.3)
Alpha 2: 0.8 g/dL (ref 0.5–0.9)
Beta 2: 0.4 g/dL (ref 0.2–0.5)
Beta Globulin: 0.5 g/dL (ref 0.4–0.6)
Gamma Globulin: 0.8 g/dL (ref 0.8–1.7)
Total Protein: 6.7 g/dL (ref 6.1–8.1)

## 2019-04-30 LAB — COMPLETE METABOLIC PANEL WITH GFR
AG Ratio: 1.7 (calc) (ref 1.0–2.5)
ALT: 36 U/L — ABNORMAL HIGH (ref 6–29)
AST: 23 U/L (ref 10–35)
Albumin: 4.3 g/dL (ref 3.6–5.1)
Alkaline phosphatase (APISO): 101 U/L (ref 37–153)
BUN: 9 mg/dL (ref 7–25)
CO2: 26 mmol/L (ref 20–32)
Calcium: 9.8 mg/dL (ref 8.6–10.4)
Chloride: 103 mmol/L (ref 98–110)
Creat: 0.66 mg/dL (ref 0.50–1.05)
GFR, Est African American: 116 mL/min/{1.73_m2} (ref >=60)
GFR, Est Non African American: 100 mL/min/{1.73_m2} (ref >=60)
Globulin: 2.6 g/dL (calc) (ref 1.9–3.7)
Glucose, Bld: 92 mg/dL (ref 65–139)
Potassium: 4.9 mmol/L (ref 3.5–5.3)
Sodium: 140 mmol/L (ref 135–146)
Total Bilirubin: 0.7 mg/dL (ref 0.2–1.2)
Total Protein: 6.9 g/dL (ref 6.1–8.1)

## 2019-04-30 LAB — QUANTIFERON-TB GOLD PLUS
Mitogen-NIL: 9.26 IU/mL
NIL: 0.09 IU/mL
QuantiFERON-TB Gold Plus: NEGATIVE
TB1-NIL: <0 IU/mL
TB2-NIL: <0 IU/mL

## 2019-04-30 LAB — HEPATITIS B SURFACE ANTIGEN: Hepatitis B Surface Ag: NONREACTIVE

## 2019-04-30 LAB — HEPATITIS C ANTIBODY
Hepatitis C Ab: NONREACTIVE
SIGNAL TO CUT-OFF: 0.01 (ref <1.00)

## 2019-04-30 LAB — HEPATITIS B CORE ANTIBODY, IGM: Hep B C IgM: NONREACTIVE

## 2019-04-30 MED ORDER — FOLIC ACID 1 MG PO TABS: 1.0000 mg | ORAL_TABLET | Freq: Every day | ORAL | 3 refills | Status: DC

## 2019-04-30 MED ORDER — METHOTREXATE 2.5 MG PO TABS: 10.0000 mg | ORAL_TABLET | ORAL | 0 refills | Status: DC

## 2019-04-30 NOTE — Addendum Note (Signed)
Addended by: Henriette Combs on: 04/30/2019 03:05 PM   Modules accepted: Orders

## 2019-04-30 NOTE — Telephone Encounter (Signed)
Notes recorded by Gearldine Bienenstock, PA-C on 04/30/2019 at 12:09 PM EDT Hept B and hep C negative. SPEP WNL. TB gold negative.  Ok to send in prescription for MTX 4 tablets po once weekly and folic acid 1 mg po daily.  Attempted to contact the patient and unable to leave a message mailbox is full.

## 2019-04-30 NOTE — Progress Notes (Signed)
Hept B and hep C negative. SPEP WNL.  TB gold negative.   Ok to send in prescription for MTX 4 tablets po once weekly and folic acid 1 mg po daily.

## 2019-05-20 ENCOUNTER — Ambulatory Visit: Payer: Self-pay | Admitting: Rheumatology

## 2019-06-17 ENCOUNTER — Ambulatory Visit (INDEPENDENT_AMBULATORY_CARE_PROVIDER_SITE_OTHER): Payer: BC Managed Care – PPO | Admitting: Otolaryngology

## 2019-06-17 ENCOUNTER — Telehealth: Payer: Self-pay | Admitting: Rheumatology

## 2019-06-17 DIAGNOSIS — J322 Chronic ethmoidal sinusitis: Secondary | ICD-10-CM | POA: Diagnosis not present

## 2019-06-17 DIAGNOSIS — J323 Chronic sphenoidal sinusitis: Secondary | ICD-10-CM

## 2019-06-17 DIAGNOSIS — J320 Chronic maxillary sinusitis: Secondary | ICD-10-CM | POA: Diagnosis not present

## 2019-06-17 DIAGNOSIS — Z79899 Other long term (current) drug therapy: Secondary | ICD-10-CM

## 2019-06-17 DIAGNOSIS — J342 Deviated nasal septum: Secondary | ICD-10-CM

## 2019-06-17 NOTE — Telephone Encounter (Signed)
Lab orders released.  

## 2019-06-17 NOTE — Telephone Encounter (Signed)
Patient called requesting her labwork orders be sent to [facility] in [place] on [address].  Patient states she is scheduled for an appointment at 3:00 pm.

## 2019-06-18 LAB — CBC WITH DIFFERENTIAL/PLATELET
Absolute Monocytes: 304 cells/uL (ref 200–950)
Basophils Absolute: 59 cells/uL (ref 0–200)
Basophils Relative: 0.6 %
Eosinophils Absolute: 216 cells/uL (ref 15–500)
Eosinophils Relative: 2.2 %
HCT: 43.1 % (ref 35.0–45.0)
Hemoglobin: 14.1 g/dL (ref 11.7–15.5)
Lymphs Abs: 3812 cells/uL (ref 850–3900)
MCH: 28.7 pg (ref 27.0–33.0)
MCHC: 32.7 g/dL (ref 32.0–36.0)
MCV: 87.6 fL (ref 80.0–100.0)
MPV: 11.4 fL (ref 7.5–12.5)
Monocytes Relative: 3.1 %
Neutro Abs: 5410 cells/uL (ref 1500–7800)
Neutrophils Relative %: 55.2 %
Platelets: 320 10*3/uL (ref 140–400)
RBC: 4.92 10*6/uL (ref 3.80–5.10)
RDW: 13.6 % (ref 11.0–15.0)
Total Lymphocyte: 38.9 %
WBC: 9.8 10*3/uL (ref 3.8–10.8)

## 2019-06-18 LAB — COMPLETE METABOLIC PANEL WITH GFR
AG Ratio: 1.5 (calc) (ref 1.0–2.5)
ALT: 22 U/L (ref 6–29)
AST: 24 U/L (ref 10–35)
Albumin: 4.1 g/dL (ref 3.6–5.1)
Alkaline phosphatase (APISO): 101 U/L (ref 37–153)
BUN: 9 mg/dL (ref 7–25)
CO2: 29 mmol/L (ref 20–32)
Calcium: 9.7 mg/dL (ref 8.6–10.4)
Chloride: 107 mmol/L (ref 98–110)
Creat: 0.72 mg/dL (ref 0.50–1.05)
GFR, Est African American: 110 mL/min/{1.73_m2} (ref >=60)
GFR, Est Non African American: 95 mL/min/{1.73_m2} (ref >=60)
Globulin: 2.8 g/dL (calc) (ref 1.9–3.7)
Glucose, Bld: 111 mg/dL (ref 65–139)
Potassium: 5.5 mmol/L — ABNORMAL HIGH (ref 3.5–5.3)
Sodium: 144 mmol/L (ref 135–146)
Total Bilirubin: 0.7 mg/dL (ref 0.2–1.2)
Total Protein: 6.9 g/dL (ref 6.1–8.1)

## 2019-06-18 NOTE — Telephone Encounter (Signed)
Potassium is high.  Most likely the sample is hemolyzed.

## 2019-07-08 ENCOUNTER — Telehealth: Payer: Self-pay | Admitting: Rheumatology

## 2019-07-08 NOTE — Telephone Encounter (Signed)
Pelvis X-ray report from 04/19/2019 has been forwarded to Dr. Gerarda Fraction. Patient has been notified.

## 2019-07-08 NOTE — Telephone Encounter (Signed)
Patient called stating Dr. Gerarda Fraction her PCP wants to order x-rays of her hip and pelvis   Patient states she had an x-ray of her pelvis at her appointment with Dr. Estanislado Pandy on 04/19/19.  Patient is requesting a copy of the report to be sent to his office so she doesn't have to repeat the x-ray.

## 2019-07-09 NOTE — Progress Notes (Signed)
Office Visit Note  Patient: Amy Mccarthy             Date of Birth: 10/10/1966           MRN: [id number]             PCP: Elfredia Nevins, MD Referring: Elfredia Nevins, MD Visit Date: 07/22/2019 Occupation: @GUAROCC @  Subjective:  Psoriasis and eczema   History of Present Illness: Amy Mccarthy is a 53 y.o. female with history of seropositive rheumatoid arthritis, fibromyalgia, and osteoarthritis.  She is currently taking methotrexate 4 tablets po once weekly and folic acid 1 mg po daily.  She restarted MTX in May 2020.  He has noticed improvement in her joint pain since restarting MTX.  She has not noticed improvement in her psoriasis.  She continues to have scattered patches of psoriasis and eczema.  She has seen a dermatologist in the past, and she has tried several topical agents without much relief.  She has not seen her dermatologist recently. She has been on 2 prednisone tapers recently, which caused her psoriasis and skin irritation to settle down temporarily.  She continues to have generalized muscle aches and muscle tenderness due to fibromyalgia.  She experiences lower back pain if she stands for prolonged periods of time at work as a June 2020.   Activities of Daily Living:  Patient reports morning stiffness for several hours.   Patient Reports nocturnal pain.  Difficulty dressing/grooming: Denies Difficulty climbing stairs: Denies Difficulty getting out of chair: Denies Difficulty using hands for taps, buttons, cutlery, and/or writing: Reports  Review of Systems  Constitutional: Positive for fatigue.  HENT: Positive for mouth sores and nose dryness. Negative for mouth dryness.   Eyes: Positive for itching and dryness. Negative for pain and visual disturbance.  Respiratory: Negative for cough, hemoptysis, shortness of breath, wheezing and difficulty breathing.   Cardiovascular: Negative for chest pain, palpitations, hypertension and swelling in legs/feet.   Gastrointestinal: Negative for abdominal pain, blood in stool, constipation and diarrhea.  Endocrine: Negative for increased urination.  Genitourinary: Negative for painful urination.  Musculoskeletal: Positive for arthralgias, joint pain and morning stiffness. Negative for joint swelling, myalgias, muscle weakness, muscle tenderness and myalgias.  Skin: Positive for rash and hair loss. Negative for color change, pallor, nodules/bumps, skin tightness, ulcers and sensitivity to sunlight.  Allergic/Immunologic: Negative for susceptible to infections.  Neurological: Negative for numbness and memory loss.  Hematological: Negative for bruising/bleeding tendency and swollen glands.  Psychiatric/Behavioral: Positive for sleep disturbance. Negative for depressed mood and confusion. The patient is not nervous/anxious.     PMFS History:  Patient Active Problem List   Diagnosis Date Noted  . Rheumatoid arthritis 11/15/2016  . High risk medication use 11/15/2016  . Fibromyalgia 11/15/2016  . Psoriasis 11/15/2016  . 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 Smoker 11/15/2016  . Hypothyroidism 11/15/2016    Past Medical History:  Diagnosis Date  . Arthritis   . Rheumatoid arthritis(714.0) 8/13  . Right shoulder pain   . Shoulder pain, left   . Thyroid disease     Family History  Problem Relation Age of Onset  . Hypothyroidism Mother   . Hypothyroidism Maternal Grandmother   . Diabetes Father   . COPD Father   . Emphysema Brother   . COPD Brother    Past Surgical History:  Procedure Laterality Date  . CERVICAL DISC SURGERY     Social History   Social History Narrative  . Not  on file    There is no immunization history on file for this patient.   Objective: Vital Signs: BP 117/64 (BP Location: Left Arm, Patient Position: Sitting, Cuff Size: Normal)   Pulse 65   Resp 13   Ht 5\' 2"  (1.575 m)   Wt 217 lb 3.2 oz (98.5 kg)   BMI 39.73 kg/m    Physical Exam Vitals  signs and nursing note reviewed.  Constitutional:      Appearance: She is well-developed.  HENT:     Head: Normocephalic and atraumatic.  Eyes:     Conjunctiva/sclera: Conjunctivae normal.  Neck:     Musculoskeletal: Normal range of motion.     Comments: Occipital lymphadenitis  Cardiovascular:     Rate and Rhythm: Normal rate and regular rhythm.     Heart sounds: Normal heart sounds.  Pulmonary:     Effort: Pulmonary effort is normal.     Breath sounds: Normal breath sounds.  Abdominal:     General: Bowel sounds are normal.     Palpations: Abdomen is soft.  Lymphadenopathy:     Cervical: No cervical adenopathy (Occipital lymphadenitis ).  Skin:    General: Skin is warm and dry.     Capillary Refill: Capillary refill takes less than 2 seconds.     Comments: Scaly erythematous lesions at base of scalp along the hairline.  Lymphadenitis noted.   Neurological:     Mental Status: She is alert and oriented to person, place, and time.  Psychiatric:        Behavior: Behavior normal.      Musculoskeletal Exam: C-spine, thoracic spine, lumbar spine good range of motion.  No midline spinal tenderness.  No SI joint tenderness.  Shoulder joints, elbow joints, wrist joints, MCPs, PIPs, DIPs good range of motion no synovitis.  She has complete fist formation bilaterally.  Hip joints, knee joints, ankle joints, MTPs, PIPs, DIPs good range of motion no synovitis.  No warmth or effusion of bilateral knee joints.  No tenderness or swelling of ankle joints.  No tenderness over trochanter bursa bilaterally.  CDAI Exam: CDAI Score: - Patient Global: -; Provider Global: - Swollen: -; Tender: - Joint Exam   No joint exam has been documented for this visit   There is currently no information documented on the homunculus. Go to the Rheumatology activity and complete the homunculus joint exam.  Investigation: No additional findings.  Imaging: No results found.  Recent Labs: Lab Results   Component Value Date   WBC 9.8 06/17/2019   HGB 14.1 06/17/2019   PLT 320 06/17/2019   NA 144 06/17/2019   K 5.5 (H) 06/17/2019   CL 107 06/17/2019   CO2 29 06/17/2019   GLUCOSE 111 06/17/2019   BUN 9 06/17/2019   CREATININE 0.72 06/17/2019   BILITOT 0.7 06/17/2019   ALKPHOS 106 08/27/2011   AST 24 06/17/2019   ALT 22 06/17/2019   PROT 6.9 06/17/2019   ALBUMIN 3.9 08/27/2011   CALCIUM 9.7 06/17/2019   GFRAA 110 06/17/2019   QFTBGOLDPLUS NEGATIVE 04/25/2019    Speciality Comments: No specialty comments available.  Procedures:  No procedures performed Allergies: Patient has no known allergies.   Assessment / Plan:     Visit Diagnoses: Rheumatoid arthritis involving multiple sites with positive rheumatoid factor (HCC) -  Positive rheumatoid factor, positive CCP, positive ANA, +14 33 eta: She has no synovitis on exam.  She restarted on methotrexate 4 tablets by mouth once weekly in May 2020.  She has noticed significant improvement in her joint pain and discomfort since restarting.  She continues to have some discomfort in bilateral hands and bilateral feet but is not noticed any inflammation.  She has complete fist formation bilaterally today.  She will continue taking methotrexate 4 tablets by mouth once weekly and folic acid 1 mg by mouth daily.  We discussed the importance of holding methotrexate if she develops any signs or symptoms of an infection and to resume once the infection has completely cleared.  She was advised to notify us if she develops increased joint pain or joint swelling.  She will follow-up in the office in 5 months.  High risk medication use - Restarted on MTX 4 tablets po once weekly at her last visit in May 2020-(previously took MTX in 2017) -CBC and CMP are drawn on 06/17/2019.  She will return for lab work in October and every 3 months to monitor for drug toxicity.  She will be starting on Keflex 500 mg 4 times daily for 7 days for treatment of impetigo.  She is  advised to hold methotrexate while she is being treated with Keflex.  Fibromyalgia - She continues have generalized muscle aches and muscle tenderness due to fibromyalgia.  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  She continues to have chronic fatigue related to insomnia. We discussed the importance of staying active and exercising on a regular basis.  Other psoriasis - Diagnosed by dermatologist.  She has tried topical agents in the past which have been ineffective.  She has an overlap of psoriasis and eczema.  She restarted on methotrexate 4 tablets by mouth once weekly in May 2020 and has not noticed any improvement in her psoriasis.  She would like a referral to [facility] dermatology for further evaluation and treatment.  Impetigo -She has a scaly erythematous rash on the base of her scalp.  She has scalp psoriasis and has been having increased skin irritation and has been picking at her scalp.  She was given a prescription for Keflex 500 mg 4 times daily for 7 days.  She was advised to call her PCP ASAP for follow-up and management.  DDD (degenerative disc disease), lumbar -She has intermittent lower back pain.  She experiences increased discomfort if she is standing for prolonged periods of time.  She has no midline spinal tenderness at this time.  Other medical conditions are listed as follows:   History of shingles  History of hypothyroidism   Current smoker   Orders: Orders Placed This Encounter  Procedures  . Ambulatory referral to Dermatology   Meds ordered this encounter  Medications  . cephALEXin (KEFLEX) 500 MG capsule    Sig: Take 1 capsule (500 mg total) by mouth 4 (four) times daily.    Dispense:  28 capsule    Refill:  0   Face-to-face time spent with patient was 30 minutes. Greater than 50% of time was spent in counseling and coordination of care.  Follow-Up Instructions: Return in about 5 months  (around 12/22/2019) for Rheumatoid arthritis, Fibromyalgia, Osteoarthritis.   Ofilia Neas, PA-C   I examined and evaluated the patient with Hazel Sams PA.  Patient's arthritis is well controlled on methotrexate.  Although she has severe eczema which has not been controlled.  Per her request we will make a referral to dermatology.  She also had impetigo on her scalp with lymphadenopathy.  We decided to start her on  Keflex.  I did not want to delay the treatment as she is on immunosuppressive agents.  Have advised her to follow-up with her PCP regarding the management.  The plan of care was discussed as noted above.  Pollyann Savoy, MD  Note - This record has been created using Animal nutritionist.  Chart creation errors have been sought, but may not always  have been located. Such creation errors do not reflect on  the standard of medical care.

## 2019-07-22 ENCOUNTER — Other Ambulatory Visit: Payer: Self-pay

## 2019-07-22 ENCOUNTER — Encounter: Payer: Self-pay | Admitting: Rheumatology

## 2019-07-22 ENCOUNTER — Ambulatory Visit: Payer: BC Managed Care – PPO | Admitting: Rheumatology

## 2019-07-22 VITALS — BP 117/64 | HR 65 | Resp 13 | Ht 62.0 in | Wt 217.2 lb

## 2019-07-22 DIAGNOSIS — L408 Other psoriasis: Secondary | ICD-10-CM

## 2019-07-22 DIAGNOSIS — M0579 Rheumatoid arthritis with rheumatoid factor of multiple sites without organ or systems involvement: Secondary | ICD-10-CM

## 2019-07-22 DIAGNOSIS — M51369 Other intervertebral disc degeneration, lumbar region without mention of lumbar back pain or lower extremity pain: Secondary | ICD-10-CM

## 2019-07-22 DIAGNOSIS — L308 Other specified dermatitis: Secondary | ICD-10-CM

## 2019-07-22 DIAGNOSIS — L0100 Impetigo, unspecified: Secondary | ICD-10-CM

## 2019-07-22 DIAGNOSIS — M5136 Other intervertebral disc degeneration, lumbar region: Secondary | ICD-10-CM

## 2019-07-22 DIAGNOSIS — Z79899 Other long term (current) drug therapy: Secondary | ICD-10-CM

## 2019-07-22 DIAGNOSIS — Z8639 Personal history of other endocrine, nutritional and metabolic disease: Secondary | ICD-10-CM

## 2019-07-22 DIAGNOSIS — M797 Fibromyalgia: Secondary | ICD-10-CM

## 2019-07-22 DIAGNOSIS — F17200 Nicotine dependence, unspecified, uncomplicated: Secondary | ICD-10-CM

## 2019-07-22 DIAGNOSIS — Z8619 Personal history of other infectious and parasitic diseases: Secondary | ICD-10-CM

## 2019-07-22 MED ORDER — CEPHALEXIN 500 MG PO CAPS: 500.0000 mg | ORAL_CAPSULE | Freq: Four times a day (QID) | ORAL | 0 refills | Status: DC

## 2019-08-19 ENCOUNTER — Telehealth: Payer: Self-pay | Admitting: Rheumatology

## 2019-08-19 NOTE — Telephone Encounter (Signed)
Patient calling to discuss Dermatology referral, and what PCP has said. Please call to discuss.

## 2019-08-20 NOTE — Telephone Encounter (Signed)
Patient states she was seen in the office a couple of weeks ago. Patient states when she was in the office she was advised she was going to be given a referral to dermatology for psoriasis and eczema. Patient states she saw her PCP yesterday and was advised she has scabies and given a prescription. Patient was asking about her referral to dermatology. Patient advised the referral has been placed and [facility] dermatology has tried to reach out to her to schedule appointment. Provided patient with number to schedule appointment.

## 2019-12-16 ENCOUNTER — Ambulatory Visit: Payer: BC Managed Care – PPO | Admitting: Rheumatology

## 2021-11-06 ENCOUNTER — Other Ambulatory Visit: Payer: Self-pay

## 2021-11-06 ENCOUNTER — Ambulatory Visit
Admission: EM | Admit: 2021-11-06 | Discharge: 2021-11-06 | Disposition: A | Discharge status: Home / Self Care | Payer: 59

## 2021-11-06 DIAGNOSIS — L03115 Cellulitis of right lower limb: Secondary | ICD-10-CM | POA: Diagnosis not present

## 2021-11-06 MED ORDER — CEPHALEXIN 500 MG PO CAPS: 500.0000 mg | ORAL_CAPSULE | Freq: Two times a day (BID) | ORAL | 0 refills | Status: AC

## 2021-11-06 NOTE — Discharge Instructions (Addendum)
Take medication as prescribed Follow up with dermatology if no improvement

## 2021-11-06 NOTE — ED Triage Notes (Signed)
Patient states her right leg has a red rash on the front of her shin. She states she has swelling. She went to the dermatologist Monday and she was given a new cream for her leg. She tried it one time and that's when it turned red and swollen.  She tried elevation last night and that helped.   Denies Fever.

## 2021-11-06 NOTE — ED Provider Notes (Signed)
[facility]    CSN: [id number] Arrival date & time: 11/06/21  1309      History   Chief Complaint No chief complaint on file.   HPI Amy Mccarthy is a 55 y.o. female.   Pt complains of right lower leg redness, warmth, and pain that started several days ago. She has a h/o psoriasis with plaques to the right lower leg.  She was recently started on a new cream by dermatology. She has discontinued the cream at this time.  She denies fever, chills.  She reports elevation has improved swelling somewhat.    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      Home Medications    Prior to Admission medications   Medication Sig Start Date End Date Taking? Authorizing Provider  buPROPion (WELLBUTRIN XL) 300 MG 24 hr tablet Take 300 mg by mouth daily.   Yes [provider]  cephALEXin (KEFLEX) 500 MG capsule Take 1 capsule (500 mg total) by mouth 2 (two) times daily for 7 days. 11/06/21 11/13/21 Yes Ward, Tylene Fantasia, PA-C  APPLE CIDER VINEGAR PO Take by mouth daily.    [provider]  Ascorbic Acid (VITAMIN C PO) Take by mouth daily.    [provider]  Biotin w/ Vitamins C & E (HAIR/SKIN/NAILS PO) Take by mouth daily.    [provider]  folic acid (FOLVITE) 1 MG tablet Take 1 tablet (1 mg total) by mouth daily. 04/30/19   Pollyann Savoy, MD  methotrexate (RHEUMATREX) 2.5 MG tablet Take 4 tablets (10 mg total) by mouth once  a week. Caution:Chemotherapy. Protect from light. 04/30/19   Pollyann Savoy, MD  PREVIDENT 5000 BOOSTER PLUS 1.1 % PSTE BRUSH TEETH WITH PASTE BID 03/31/19   [provider]    Family History Family History  Problem Relation Age of Onset   Hypothyroidism Mother    Hypothyroidism Maternal Grandmother    Diabetes Father    COPD Father    Emphysema Brother    COPD Brother     Social History Social History   Tobacco Use   Smoking status: Every Day    Packs/day: 1.00    Types: Cigarettes    Start date: 09/04/1981   Smokeless tobacco: Never  Vaping Use   Vaping Use: Former   Devices: occ use   Substance Use Topics   Alcohol use: Yes    Comment: occ   Drug use: No     Allergies   Patient has no known allergies.   Review of Systems Review of Systems  Constitutional:  Negative for chills and fever.  HENT:  Negative for ear pain and sore throat.   Eyes:  Negative for pain and visual disturbance.  Respiratory:  Negative for cough and shortness of breath.   Cardiovascular:  Negative for chest pain and palpitations.  Gastrointestinal:  Negative for abdominal pain and  vomiting.  Genitourinary:  Negative for dysuria and hematuria.  Musculoskeletal:  Negative for arthralgias and back pain.  Skin:  Positive for color change (right lower leg) and rash.  Neurological:  Negative for seizures and syncope.  All other systems reviewed and are negative.   Physical Exam Triage Vital Signs ED Triage Vitals  Enc Vitals Group     BP 11/06/21 1446 (!) 148/79     Pulse Rate 11/06/21 1446 81     Resp 11/06/21 1446 (!) 22     Temp 11/06/21 1446 98 F (36.7 C)     Temp Source 11/06/21 1446 Oral     SpO2 11/06/21 1446 94 %     Weight --      Height --      Head Circumference --      Peak Flow --      Pain Score 11/06/21 1445 4     Pain Loc --      Pain Edu? --      Excl. in [place]? --    No data found.  Updated Vital Signs BP (!) 148/79 (BP Location: Right Arm) Comment  (BP Location): large cuff  Pulse 81   Temp 98 F (36.7 C) (Oral)   Resp (!) 22   SpO2 94%   Visual Acuity Right Eye Distance:   Left Eye Distance:   Bilateral Distance:    Right Eye Near:   Left Eye Near:    Bilateral Near:     Physical Exam Vitals and nursing note reviewed.  Constitutional:      General: She is not in acute distress.    Appearance: She is well-developed.  HENT:     Head: Normocephalic and atraumatic.  Eyes:     Conjunctiva/sclera: Conjunctivae normal.  Cardiovascular:     Rate and Rhythm: Normal rate and regular rhythm.     Heart sounds: No murmur heard. Pulmonary:     Effort: Pulmonary effort is normal. No respiratory distress.     Breath sounds: Normal breath sounds.  Abdominal:     Palpations: Abdomen is soft.     Tenderness: There is no abdominal tenderness.  Musculoskeletal:        General: No swelling.     Cervical back: Neck supple.  Skin:    General: Skin is warm and dry.     Capillary Refill: Capillary refill takes less than 2 seconds.       Neurological:     Mental Status: She is alert.  Psychiatric:        Mood and Affect: Mood normal.     UC Treatments / Results  Labs (all labs ordered are listed, but only abnormal results are displayed) Labs Reviewed - No data to display  EKG   Radiology No results found.  Procedures Procedures (including critical care time)  Medications Ordered in UC Medications - No data to display  Initial Impression / Assessment and Plan / UC Course  I have reviewed the triage vital signs and the nursing notes.  Pertinent labs & imaging results that were available during my care of the patient were reviewed by me and considered in my medical decision making (see chart for details).     Will treat for cellulitis.  Advised pt to follow up with dermatology if symptoms persist.  Pt afebrile, stable for discharge.  Final Clinical Impressions(s) / UC Diagnoses   Final diagnoses:  Cellulitis of  leg, right     Discharge Instructions  Take medication as prescribed Follow up with dermatology if no improvement    ED Prescriptions     Medication Sig Dispense Auth. Provider   cephALEXin (KEFLEX) 500 MG capsule Take 1 capsule (500 mg total) by mouth 2 (two) times daily for 7 days. 14 capsule Ward, Lenise Arena, PA-C      PDMP not reviewed this encounter.   Ward, Lenise Arena, PA-C 11/06/21 [phone]

## 2022-05-19 DIAGNOSIS — M5459 Other low back pain: Secondary | ICD-10-CM | POA: Diagnosis not present

## 2022-05-19 DIAGNOSIS — M25552 Pain in left hip: Secondary | ICD-10-CM | POA: Diagnosis not present

## 2022-05-19 DIAGNOSIS — M25551 Pain in right hip: Secondary | ICD-10-CM | POA: Diagnosis not present

## 2022-05-30 DIAGNOSIS — Z6841 Body Mass Index (BMI) 40.0 and over, adult: Secondary | ICD-10-CM | POA: Diagnosis not present

## 2022-05-30 DIAGNOSIS — Z01419 Encounter for gynecological examination (general) (routine) without abnormal findings: Secondary | ICD-10-CM | POA: Diagnosis not present

## 2022-05-30 DIAGNOSIS — L409 Psoriasis, unspecified: Secondary | ICD-10-CM | POA: Diagnosis not present

## 2022-05-30 DIAGNOSIS — Z1231 Encounter for screening mammogram for malignant neoplasm of breast: Secondary | ICD-10-CM | POA: Diagnosis not present

## 2022-06-11 ENCOUNTER — Other Ambulatory Visit: Payer: Self-pay

## 2022-06-11 ENCOUNTER — Emergency Department (HOSPITAL_BASED_OUTPATIENT_CLINIC_OR_DEPARTMENT_OTHER): Payer: 59

## 2022-06-11 ENCOUNTER — Emergency Department (HOSPITAL_COMMUNITY)
Admission: EM | Admit: 2022-06-11 | Discharge: 2022-06-11 | Disposition: A | Discharge status: Home / Self Care | Payer: 59 | Attending: Emergency Medicine | Admitting: Emergency Medicine

## 2022-06-11 ENCOUNTER — Encounter (HOSPITAL_COMMUNITY): Payer: Self-pay

## 2022-06-11 DIAGNOSIS — M7989 Other specified soft tissue disorders: Secondary | ICD-10-CM

## 2022-06-11 DIAGNOSIS — L409 Psoriasis, unspecified: Secondary | ICD-10-CM | POA: Insufficient documentation

## 2022-06-11 DIAGNOSIS — R600 Localized edema: Secondary | ICD-10-CM | POA: Insufficient documentation

## 2022-06-11 DIAGNOSIS — M79604 Pain in right leg: Secondary | ICD-10-CM | POA: Insufficient documentation

## 2022-06-11 DIAGNOSIS — L538 Other specified erythematous conditions: Secondary | ICD-10-CM | POA: Diagnosis not present

## 2022-06-11 DIAGNOSIS — M79609 Pain in unspecified limb: Secondary | ICD-10-CM

## 2022-06-11 LAB — CBC WITH DIFFERENTIAL/PLATELET
Abs Immature Granulocytes: 0.02 10*3/uL (ref 0.00–0.07)
Basophils Absolute: 0.1 10*3/uL (ref 0.0–0.1)
Basophils Relative: 1 %
Eosinophils Absolute: 0.2 10*3/uL (ref 0.0–0.5)
Eosinophils Relative: 3 %
HCT: 42.1 % (ref 36.0–46.0)
Hemoglobin: 13.1 g/dL (ref 12.0–15.0)
Immature Granulocytes: 0 %
Lymphocytes Relative: 29 %
Lymphs Abs: 2 10*3/uL (ref 0.7–4.0)
MCH: 27.3 pg (ref 26.0–34.0)
MCHC: 31.1 g/dL (ref 30.0–36.0)
MCV: 87.9 fL (ref 80.0–100.0)
Monocytes Absolute: 0.3 10*3/uL (ref 0.1–1.0)
Monocytes Relative: 5 %
Neutro Abs: 4.1 10*3/uL (ref 1.7–7.7)
Neutrophils Relative %: 62 %
Platelets: 268 10*3/uL (ref 150–400)
RBC: 4.79 MIL/uL (ref 3.87–5.11)
RDW: 14.2 % (ref 11.5–15.5)
WBC: 6.7 10*3/uL (ref 4.0–10.5)
nRBC: 0 % (ref 0.0–0.2)

## 2022-06-11 LAB — BASIC METABOLIC PANEL
Anion gap: 8 (ref 5–15)
BUN: 9 mg/dL (ref 6–20)
CO2: 24 mmol/L (ref 22–32)
Calcium: 9.1 mg/dL (ref 8.9–10.3)
Chloride: 108 mmol/L (ref 98–111)
Creatinine, Ser: 0.64 mg/dL (ref 0.44–1.00)
GFR, Estimated: >60 mL/min (ref >60)
Glucose, Bld: 112 mg/dL — ABNORMAL HIGH (ref 70–99)
Potassium: 4.5 mmol/L (ref 3.5–5.1)
Sodium: 140 mmol/L (ref 135–145)

## 2022-06-11 MED ORDER — DOXYCYCLINE HYCLATE 100 MG PO CAPS: 100.0000 mg | ORAL_CAPSULE | Freq: Two times a day (BID) | ORAL | 0 refills | Status: AC

## 2022-06-11 MED ORDER — CEPHALEXIN 500 MG PO CAPS: 500.0000 mg | ORAL_CAPSULE | Freq: Four times a day (QID) | ORAL | 0 refills | Status: DC

## 2022-06-11 NOTE — ED Provider Triage Note (Signed)
Emergency Medicine Provider Triage Evaluation Note  KARCYN MENN , a 56 y.o. female  was evaluated in triage.  Pt complains of patient here with complaint of right leg swelling, history of psoriasis, pain all the way up to her thigh.  No previous history of DVT, no fevers or chills..  Review of Systems  Positive: Right leg swelling Negative: Chest pain shortness of breath  Physical Exam  BP (!) 128/106 (BP Location: Right Arm)   Pulse 77   Temp 98.4 F (36.9 C) (Oral)   Resp 16   SpO2 96%  Gen:   Awake, no distress   Resp:  Normal effort  MSK:   Moves extremities without difficulty  Other:  Right leg with erythema tenderness and swelling, psoriatic sores on the leg.  Medical Decision Making  Medically screening exam initiated at 12:37 PM.  Appropriate orders placed.  SHARRYN BELDING was informed that the remainder of the evaluation will be completed by another provider, this initial triage assessment does not replace that evaluation, and the importance of remaining in the ED until their evaluation is complete.  Work-up initiated   Arthor Captain, PA-C 06/11/22 1238

## 2022-06-11 NOTE — ED Notes (Signed)
Patient transported to Vascular 

## 2022-06-11 NOTE — ED Notes (Signed)
Pt refused vitals 

## 2022-06-11 NOTE — Discharge Instructions (Addendum)
Please take the 5-day course of antibiotics and follow-up with your PCP and dermatologist

## 2022-06-11 NOTE — ED Triage Notes (Signed)
Patient complains of right leg swelling and pain that has increased the past several days. Redness that she states is normal for her. Pain now running up thigh, no trauma

## 2022-06-11 NOTE — Progress Notes (Signed)
VASCULAR LAB    Right lower extremity venous duplex has been performed.  See CV proc for preliminary results.  Messaged results Arthor Captain, PA-C and [place], RN  Cydni Reddoch, RVT 06/11/2022, 1:45 PM

## 2022-06-11 NOTE — ED Provider Notes (Signed)
[facility] EMERGENCY DEPARTMENT Provider Note   CSN: [id number] Arrival date & time: 06/11/22  1214     History  No chief complaint on file.   AIYANAH Mccarthy is a 56 y.o. female.  HPI  56 year old female with a history of psoriasis presenting to the emergency department with worsening right leg swelling and pain.  The patient is outpatient treated for psoriasis with dermatology.  She states that she has had right leg redness and crusting lesions for the past year.  She endorses worsening pain and swelling over the past 2 weeks.  She recently completed a course of prednisone for pain in her hip.  Denies any recent change to the redness in her right lower extremity.  She denies any recent trauma.  She endorses pain radiating up to her right thigh.  Home Medications Prior to Admission medications   Medication Sig Start Date End Date Taking? Authorizing Provider  cephALEXin (KEFLEX) 500 MG capsule Take 1 capsule (500 mg total) by mouth 4 (four) times daily. 06/11/22  Yes Ernie Avena, MD  doxycycline (VIBRAMYCIN) 100 MG capsule Take 1 capsule (100 mg total) by mouth 2 (two) times daily for 5 days. 06/11/22 06/16/22 Yes Ernie Avena, MD  APPLE CIDER VINEGAR PO Take by mouth daily.    [provider]  Ascorbic Acid (VITAMIN C PO) Take by mouth daily.    [provider]  Biotin w/ Vitamins C & E (HAIR/SKIN/NAILS PO) Take by mouth daily.    [provider]  buPROPion (WELLBUTRIN XL) 300 MG 24 hr tablet Take 300 mg by mouth daily.    [provider]  folic acid (FOLVITE) 1 MG tablet Take 1 tablet (1 mg total) by mouth daily. 04/30/19   Pollyann Savoy, MD  methotrexate (RHEUMATREX) 2.5 MG tablet Take 4 tablets (10 mg total) by mouth once a week. Caution:Chemotherapy. Protect from light. 04/30/19   Pollyann Savoy, MD  PREVIDENT 5000 BOOSTER PLUS 1.1 % PSTE BRUSH TEETH WITH PASTE BID 03/31/19   [provider]      Allergies     Patient has no known allergies.    Review of Systems   Review of Systems  All other systems reviewed and are negative.   Physical Exam Updated Vital Signs BP (!) 128/106 (BP Location: Right Arm)   Pulse 77   Temp 98.4 F (36.9 C) (Oral)   Resp 16   SpO2 96%  Physical Exam Vitals and nursing note reviewed.  Constitutional:      General: She is not in acute distress.    Appearance: She is well-developed.  HENT:     Head: Normocephalic and atraumatic.  Eyes:     Conjunctiva/sclera: Conjunctivae normal.  Cardiovascular:     Rate and Rhythm: Normal rate and regular rhythm.  Pulmonary:     Effort: Pulmonary effort is normal. No respiratory distress.     Breath sounds: Normal breath sounds.  Abdominal:     Palpations: Abdomen is soft.     Tenderness: There is no abdominal tenderness.  Musculoskeletal:        General: Swelling and tenderness present.     Cervical back: Neck supple.     Right lower leg: Edema present.     Comments: Right lower extremity pain and swelling, crusting excoriated lesions noted, 2+ DP pulses  Skin:    General: Skin is warm and dry.     Capillary Refill: Capillary refill takes less than 2 seconds.  Findings: Erythema present.  Neurological:     Mental Status: She is alert.  Psychiatric:        Mood and Affect: Mood normal.     ED Results / Procedures / Treatments   Labs (all labs ordered are listed, but only abnormal results are displayed) Labs Reviewed  BASIC METABOLIC PANEL - Abnormal; Notable for the following components:      Result Value   Glucose, Bld 112 (*)    All other components within normal limits  CBC WITH DIFFERENTIAL/PLATELET    EKG None  Radiology VAS [place] LOWER EXTREMITY VENOUS (DVT) (7a-7p)  Result Date: 06/11/2022  Lower Venous DVT Study Patient Name:  Amy Mccarthy  Date of Exam:   06/11/2022 Medical Rec #: [id number]       Accession #:    [id number] Date of Birth: 01-09-1966       Patient Gender: F Patient Age:    64 years Exam Location:  [facility] Procedure:      VAS [place] LOWER EXTREMITY VENOUS (DVT) Referring Phys: ABIGAIL HARRIS --------------------------------------------------------------------------------  Indications: Pain, Swelling, and Erythema.  Risk Factors: Psoriasis. Comparison Study: Prior negative right lower extremity venous ultrasound done                   11/11/17 Performing Technologist: Sherren Kerns RVS  Examination Guidelines: A complete evaluation includes B-mode imaging, spectral Doppler, color Doppler, and power Doppler as needed of all accessible portions of each vessel. Bilateral testing is considered an integral part of a complete examination. Limited examinations for reoccurring indications may be performed as noted. The reflux portion of the exam is performed with the patient in reverse Trendelenburg.  +---------+---------------+---------+-----------+----------+--------------+ RIGHT    CompressibilityPhasicitySpontaneityPropertiesThrombus Aging +---------+---------------+---------+-----------+----------+--------------+ CFV      Full           Yes      Yes                                 +---------+---------------+---------+-----------+----------+--------------+ SFJ      Full                                                        +---------+---------------+---------+-----------+----------+--------------+ FV Prox  Full                                                        +---------+---------------+---------+-----------+----------+--------------+ FV Mid   Full                                                        +---------+---------------+---------+-----------+----------+--------------+ FV DistalFull                                                        +---------+---------------+---------+-----------+----------+--------------+ PFV  Full                                                         +---------+---------------+---------+-----------+----------+--------------+ POP      Full           Yes      Yes                                 +---------+---------------+---------+-----------+----------+--------------+ PTV      Full                                                        +---------+---------------+---------+-----------+----------+--------------+ PERO     Full                                                        +---------+---------------+---------+-----------+----------+--------------+   +----+---------------+---------+-----------+----------+--------------+ LEFTCompressibilityPhasicitySpontaneityPropertiesThrombus Aging +----+---------------+---------+-----------+----------+--------------+ CFV Full           Yes      Yes                                 +----+---------------+---------+-----------+----------+--------------+     Summary: RIGHT: - There is no evidence of deep vein thrombosis in the lower extremity.  - Ultrasound characteristics of enlarged lymph nodes are noted in the groin.  LEFT: - No evidence of common femoral vein obstruction. - Ultrasound characteristics of enlarged lymph nodes noted in the groin. interstitial edema noted throughout the calf.  *See table(s) above for measurements and observations.    Preliminary     Procedures Procedures    Medications Ordered in ED Medications - No data to display  ED Course/ Medical Decision Making/ A&P                           Medical Decision Making   56 year old female with a history of psoriasis presenting to the emergency department with worsening right leg swelling and pain.  The patient is outpatient treated for psoriasis with dermatology.  She states that she has had right leg redness and crusting lesions for the past year.  She endorses worsening pain and swelling over the past 2 weeks.  She recently completed a course of prednisone for pain in her hip.  Denies any recent change to  the redness in her right lower extremity.  She denies any recent trauma.  She endorses pain radiating up to her right thigh.  Differential diagnosis on arrival includes cellulitis, psoriasis flare, DVT.  DVT ultrasound was performed in first look and was negative for acute DVT.  The patient CBC was without a leukocytosis or anemia.  BMP without electrolyte abnormality.  Due to the patient's worsening pain and swelling, considered worsening psoriasis flare versus developing cellulitis superimposed on the patient's psoriasis.  Neurovascularly intact on exam.  We will have the patient start on  antibiotics and follow-up with her dermatologist outpatient.  Stable for discharge.   Final Clinical Impression(s) / ED Diagnoses Final diagnoses:  Psoriasis  Right leg swelling  Right leg pain    Rx / DC Orders ED Discharge Orders          Ordered    cephALEXin (KEFLEX) 500 MG capsule  4 times daily        06/11/22 1719    doxycycline (VIBRAMYCIN) 100 MG capsule  2 times daily        06/11/22 1719              Regan Lemming, MD 06/11/22 1719

## 2022-06-27 NOTE — Progress Notes (Signed)
Office Visit Note  Patient: Amy Mccarthy             Date of Birth: January 21, 1966           MRN: [id number]             PCP: Elfredia Nevins, MD Referring: Elfredia Nevins, MD Visit Date: 07/11/2022 Occupation: @  Subjective:  Pain in multiple joints  History of Present Illness: Amy Mccarthy is a 56 y.o. female with history of rheumatoid arthritis, psoriasis, osteoarthritis and fibromyalgia syndrome.  She returns today after her last visit in August 2020.  She states she started following up with the dermatologist and stayed on methotrexate for short time and then discontinued.  She was a started on Humira subcu injections which she took for about 6 months and then stopped taking them as she did not notice any improvement in her symptoms.  She states her psoriasis got worse to the point her hands were blistering.  She was switched to [facility] which she discontinued about 2 months ago due to an inadequate response. She Believes that she was on [place] for about 6 months.  Patient states that she has been having increased pain and discomfort in her joints for the last several months.  She describes discomfort in her lower back, right shoulder, right hand, right knee and her bilateral feet.  She states she was seen at [facility] where she was diagnosed with disc disease of the lumbar spine.  She states the CT scan of the back was not covered by insurance.  She was sent to physical therapy.  She has not started physical therapy yet.  She was also given a prednisone taper at [facility] about 6 weeks ago which helped the psoriasis and the joint pain to some extent. She went to the emergency room as she developed severe rash on her right lower extremity.  Was diagnosed with impetigo and cellulitis.  She was treated with antibiotics.  She was given prescription for cephalexin Vibramycin.  Patient states the rash and the redness on her right lower extremity cleared after the course of  antibiotics.  Activities of Daily Living:  Patient reports morning stiffness for several hours.   Patient Reports nocturnal pain.  Difficulty dressing/grooming: Denies Difficulty climbing stairs: Reports Difficulty getting out of chair: Reports Difficulty using hands for taps, buttons, cutlery, and/or writing: Reports  Review of Systems  Constitutional:  Positive for fatigue.  HENT:  Positive for mouth sores. Negative for mouth dryness.   Eyes:  Positive for dryness.  Respiratory:  Negative for difficulty breathing.   Cardiovascular:  Positive for palpitations and swelling in legs/feet. Negative for chest pain.  Gastrointestinal:  Positive for diarrhea. Negative for blood in stool and constipation.  Endocrine: Negative for increased urination.  Genitourinary:  Negative for involuntary urination.  Musculoskeletal:  Positive for joint pain, joint pain, joint swelling, myalgias, morning stiffness and myalgias.  Skin:  Positive for rash, hair loss and redness. Negative for color change and sensitivity to sunlight.  Allergic/Immunologic: Negative for susceptible to infections.  Neurological:  Positive for numbness and parasthesias. Negative for dizziness and headaches.  Hematological:  Negative for swollen glands.  Psychiatric/Behavioral:  Positive for depressed mood and sleep disturbance. The patient is nervous/anxious.     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   Objective: Vital Signs: BP 129/79 (BP Location: Left Arm, Patient Position: Sitting, Cuff Size: Large)   Pulse 62   Resp 16   Ht 5\' 2"  (1.575 m)   Wt 225 lb 9.6 oz (102.3 kg)   LMP 09/06/2011   BMI 41.26 kg/m    Physical Exam Vitals and nursing note reviewed.  Constitutional:      Appearance: She is well-developed.  HENT:     Head: Normocephalic and atraumatic.  Eyes:     Conjunctiva/sclera: Conjunctivae normal.  Cardiovascular:     Rate and Rhythm: Normal rate and regular rhythm.     Heart sounds: Normal heart sounds.  Pulmonary:     Effort: Pulmonary effort is normal.     Breath sounds: Normal breath sounds.  Abdominal:     General: Bowel sounds are normal.     Palpations: Abdomen is soft.  Musculoskeletal:     Cervical back: Normal range of motion.     Right lower leg: Edema present.     Left lower leg: Edema present.  Lymphadenopathy:     Cervical: No cervical adenopathy.  Skin:    General: Skin is warm and dry.     Capillary Refill: Capillary refill takes less than 2 seconds.     Comments: Bilateral lower extremity erythema was noted.  Neurological:     Mental Status: She is alert and oriented to person, place, and time.  Psychiatric:        Behavior: Behavior normal.     Musculoskeletal Exam: Cervical spine and lumbar spine were in good range of motion.  She had no SI joint tenderness.  She had good range of motion of shoulder joints, elbow joints, wrist joints, MCPs PIPs and DIPs with no synovitis.  Hip joints and knee joints with good range of motion.  She had pain and discomfort range of motion of her right knee joint.  There was no tenderness over ankles or MTPs.  There was no  evidence of Planter fasciitis or Achilles tendinitis.  CDAI Exam: CDAI Score: 1.8  Patient Global: 6 mm; Provider Global: 2 mm Swollen: 0 ; Tender: 1  Joint Exam 07/11/2022      Right  Left  Knee   Tender        Investigation: No additional findings.  Imaging: VAS 09/10/2022 LOWER EXTREMITY VENOUS (DVT) (7a-7p)  Result Date: 06/13/2022  Lower Venous DVT Study Patient Name:  RIDLEY DILEO  Date of Exam:   06/11/2022 Medical Rec #: 08/12/2022       Accession #:    [id number] Date of Birth: May 14, 1966       Patient Gender: F Patient Age:   22 years Exam Location:  [address]  [place] Procedure:      VAS [place] LOWER EXTREMITY VENOUS (DVT) Referring Phys: ABIGAIL HARRIS --------------------------------------------------------------------------------  Indications: Pain, Swelling, and Erythema.  Risk Factors: Psoriasis. Comparison Study: Prior negative right lower extremity venous ultrasound done                   11/11/17 Performing Technologist: Sherren Kerns RVS  Examination Guidelines: A complete evaluation includes B-mode imaging, spectral Doppler, color Doppler, and power Doppler as needed of all accessible portions of each vessel. Bilateral testing is considered an integral part of a complete examination. Limited examinations for reoccurring indications may be performed as noted. The reflux portion of the exam is performed with the patient in reverse Trendelenburg.  +---------+---------------+---------+-----------+----------+--------------+ RIGHT    CompressibilityPhasicitySpontaneityPropertiesThrombus Aging +---------+---------------+---------+-----------+----------+--------------+ CFV      Full           Yes      Yes                                 +---------+---------------+---------+-----------+----------+--------------+ SFJ      Full                                                        +---------+---------------+---------+-----------+----------+--------------+ FV Prox  Full                                                         +---------+---------------+---------+-----------+----------+--------------+ FV Mid   Full                                                        +---------+---------------+---------+-----------+----------+--------------+ FV DistalFull                                                        +---------+---------------+---------+-----------+----------+--------------+ PFV      Full                                                        +---------+---------------+---------+-----------+----------+--------------+ POP      Full           Yes      Yes                                 +---------+---------------+---------+-----------+----------+--------------+ PTV      Full                                                        +---------+---------------+---------+-----------+----------+--------------+  PERO     Full                                                        +---------+---------------+---------+-----------+----------+--------------+   +----+---------------+---------+-----------+----------+--------------+ LEFTCompressibilityPhasicitySpontaneityPropertiesThrombus Aging +----+---------------+---------+-----------+----------+--------------+ CFV Full           Yes      Yes                                 +----+---------------+---------+-----------+----------+--------------+     Summary: RIGHT: - There is no evidence of deep vein thrombosis in the lower extremity.  - Ultrasound characteristics of enlarged lymph nodes are noted in the groin.  LEFT: - No evidence of common femoral vein obstruction. - Ultrasound characteristics of enlarged lymph nodes noted in the groin. interstitial edema noted throughout the calf.  *See table(s) above for measurements and observations. Electronically signed by Sherald Hess MD on 06/13/2022 at 1:37:16 PM.    Final     Recent Labs: Lab Results  Component Value Date   WBC 6.7  06/11/2022   HGB 13.1 06/11/2022   PLT 268 06/11/2022   NA 140 06/11/2022   K 4.5 06/11/2022   CL 108 06/11/2022   CO2 24 06/11/2022   GLUCOSE 112 (H) 06/11/2022   BUN 9 06/11/2022   CREATININE 0.64 06/11/2022   BILITOT 0.7 06/17/2019   ALKPHOS 106 08/27/2011   AST 24 06/17/2019   ALT 22 06/17/2019   PROT 6.9 06/17/2019   ALBUMIN 3.9 08/27/2011   CALCIUM 9.1 06/11/2022   GFRAA 110 06/17/2019   QFTBGOLDPLUS NEGATIVE 04/25/2019    Speciality Comments: No specialty comments available.  Procedures:  Large Joint Inj: R knee on 07/11/2022 12:30 PM Indications: pain Details: 27 G 1.5 in needle, medial approach  Arthrogram: No  Medications: 40 mg triamcinolone acetonide 40 MG/ML; 1.5 mL lidocaine 1 % Aspirate: 0 mL Outcome: tolerated well, no immediate complications Procedure, treatment alternatives, risks and benefits explained, specific risks discussed. Consent was given by the patient. Immediately prior to procedure a time out was called to verify the correct patient, procedure, equipment, support staff and site/side marked as required. Patient was prepped and draped in the usual sterile fashion.    Allergies: Patient has no known allergies.   Assessment / Plan:     Visit Diagnoses: Rheumatoid arthritis involving multiple sites with positive rheumatoid factor (HCC) - Positive rheumatoid factor, positive CCP, positive ANA, +14 33 eta:  -Patient was treated with methotrexate in the past.  Patient states that she has been off methotrexate since 2020.  She was treated with Humira for 6 months by her dermatologist which was discontinued due to an inadequate response.  She had been on [place] until 2 months ago and then she discontinued.  She states that she developed severe rash on her right lower extremity which responded to antibiotics.  On chart review she was diagnosed with cellulitis.  Patient has been off methotrexate and also Biologics for at least 2 years.  I do not see any  synovitis today.  The x-rays did not show any radiographic progression.  I do not see the need for immunosuppression at this time.  Plan: Sedimentation rate, Rheumatoid factor, Cyclic citrul peptide antibody, IgG  High risk medication use -she was on  Otez[place] by the dermatologist which she discontinued 2 months ago.  Restarted on MTX 4 tablets po once weekly at her last visit in May 2020-(previously took MTX in 2017)  - Plan: COMPLETE METABOLIC PANEL WITH GFR, QuantiFERON-TB Gold Plus  Other psoriasis-she had no active psoriasis lesions on the examination.  She had some dry scales on her feet.  She was on [place] until 2 months ago.  Pain in both hands -she complains of pain and discomfort in her bilateral hands.  No synovitis was noted.  Plan: XR Hand 2 View Right, XR Hand 2 View Left.  X-rays of bilateral hands showed juxta-articular osteopenia and PIP and DIP narrowing.  No MCP or intercarpal joint space narrowing was noted.  No radiographic progression was noted when compared with the x-rays of 2020.  Chronic pain of right knee -she complains of pain and discomfort in her right knee joint and difficulty walking due to right knee joint discomfort.  No warmth swelling or effusion was noted.  Per patient's request right knee joint was injected with lidocaine and cortisone as described above.  Patient tolerated the procedure well.  Postprocedure instructions were given.  Plan: XR KNEE 3 VIEW RIGHT.  X-rays showed mild osteoarthritis and mild chondromalacia patella.  Pain in both feet -she complains of discomfort in her bilateral feet.  No synovitis was noted.  There is no history of Achilles tendinitis of Planter fasciitis.  Plan: XR Foot 2 Views Right, XR Foot 2 Views Left.  X-rays were consistent with osteoarthritis.  No radiographic progression was noted when compared to the previous x-rays of 2020.  Right second MTP narrowing was noted which could possibly be due to inflammatory arthritis.  DDD  (degenerative disc disease), lumbar-she has been experiencing lower back pain.  She was [facility] at [facility].  She is going to physical therapy.  Fibromyalgia-she continues to have generalized pain and discomfort from fibromyalgia.  She had positive tender points.  Cellulitis of right lower limb-she has significant pedal edema on bilateral lower extremities and was recently diagnosed with cellulitis in the emergency room.  She was treated with 2 antibiotics.  She still has erythema on bilateral lower extremities.  Impetigo-diagnosed in the past.  History of hypothyroidism  History of shingles  Former smoker - Quit smoking 2022.  Smoked 1 pack/day for 40 years.  Orders: Orders Placed This Encounter  Procedures   Large Joint Inj   XR KNEE 3 VIEW RIGHT   XR Hand 2 View Right   XR Hand 2 View Left   XR Foot 2 Views Right   XR Foot 2 Views Left   COMPLETE METABOLIC PANEL WITH GFR   Sedimentation rate   Rheumatoid factor   Cyclic citrul peptide antibody, IgG   QuantiFERON-TB Gold Plus   No orders of the defined types were placed in this encounter.    Follow-Up Instructions: Return in about 2 months (around 09/10/2022) for Rheumatoid arthritis, Ps, OA.   Pollyann Savoy, MD  Note - This record has been created using Animal nutritionist.  Chart creation errors have been sought, but may not always  have been located. Such creation errors do not reflect on  the standard of medical care.

## 2022-07-11 ENCOUNTER — Ambulatory Visit: Payer: 59 | Attending: Rheumatology | Admitting: Rheumatology

## 2022-07-11 ENCOUNTER — Ambulatory Visit (INDEPENDENT_AMBULATORY_CARE_PROVIDER_SITE_OTHER): Payer: 59

## 2022-07-11 ENCOUNTER — Encounter: Payer: Self-pay | Admitting: Rheumatology

## 2022-07-11 VITALS — BP 129/79 | HR 62 | Resp 16 | Ht 62.0 in | Wt 225.6 lb

## 2022-07-11 DIAGNOSIS — L03115 Cellulitis of right lower limb: Secondary | ICD-10-CM | POA: Diagnosis not present

## 2022-07-11 DIAGNOSIS — M797 Fibromyalgia: Secondary | ICD-10-CM

## 2022-07-11 DIAGNOSIS — L408 Other psoriasis: Secondary | ICD-10-CM

## 2022-07-11 DIAGNOSIS — Z8639 Personal history of other endocrine, nutritional and metabolic disease: Secondary | ICD-10-CM

## 2022-07-11 DIAGNOSIS — M0579 Rheumatoid arthritis with rheumatoid factor of multiple sites without organ or systems involvement: Secondary | ICD-10-CM | POA: Diagnosis not present

## 2022-07-11 DIAGNOSIS — M25561 Pain in right knee: Secondary | ICD-10-CM

## 2022-07-11 DIAGNOSIS — M79672 Pain in left foot: Secondary | ICD-10-CM | POA: Diagnosis not present

## 2022-07-11 DIAGNOSIS — F17200 Nicotine dependence, unspecified, uncomplicated: Secondary | ICD-10-CM

## 2022-07-11 DIAGNOSIS — M79671 Pain in right foot: Secondary | ICD-10-CM | POA: Diagnosis not present

## 2022-07-11 DIAGNOSIS — M5136 Other intervertebral disc degeneration, lumbar region: Secondary | ICD-10-CM | POA: Diagnosis not present

## 2022-07-11 DIAGNOSIS — M79641 Pain in right hand: Secondary | ICD-10-CM

## 2022-07-11 DIAGNOSIS — M79642 Pain in left hand: Secondary | ICD-10-CM | POA: Diagnosis not present

## 2022-07-11 DIAGNOSIS — L0100 Impetigo, unspecified: Secondary | ICD-10-CM

## 2022-07-11 DIAGNOSIS — Z8619 Personal history of other infectious and parasitic diseases: Secondary | ICD-10-CM | POA: Diagnosis not present

## 2022-07-11 DIAGNOSIS — G8929 Other chronic pain: Secondary | ICD-10-CM | POA: Diagnosis not present

## 2022-07-11 DIAGNOSIS — Z79899 Other long term (current) drug therapy: Secondary | ICD-10-CM | POA: Diagnosis not present

## 2022-07-11 DIAGNOSIS — Z87891 Personal history of nicotine dependence: Secondary | ICD-10-CM

## 2022-07-11 DIAGNOSIS — M51369 Other intervertebral disc degeneration, lumbar region without mention of lumbar back pain or lower extremity pain: Secondary | ICD-10-CM

## 2022-07-11 MED ORDER — LIDOCAINE HCL 1 % IJ SOLN
1.5000 mL | INTRAMUSCULAR | Status: AC | PRN
  Administered 2022-07-11: 1.5 mL

## 2022-07-11 MED ORDER — TRIAMCINOLONE ACETONIDE 40 MG/ML IJ SUSP
40.0000 mg | INTRAMUSCULAR | Status: AC | PRN
  Administered 2022-07-11: 40 mg via INTRA_ARTICULAR

## 2022-07-14 LAB — COMPLETE METABOLIC PANEL WITH GFR
AG Ratio: 1.5 (calc) (ref 1.0–2.5)
ALT: 20 U/L (ref 6–29)
AST: 16 U/L (ref 10–35)
Albumin: 4.1 g/dL (ref 3.6–5.1)
Alkaline phosphatase (APISO): 102 U/L (ref 37–153)
BUN: 12 mg/dL (ref 7–25)
CO2: 28 mmol/L (ref 20–32)
Calcium: 9.2 mg/dL (ref 8.6–10.4)
Chloride: 105 mmol/L (ref 98–110)
Creat: 0.71 mg/dL (ref 0.50–1.03)
Globulin: 2.7 g/dL (calc) (ref 1.9–3.7)
Glucose, Bld: 80 mg/dL (ref 65–99)
Potassium: 4.2 mmol/L (ref 3.5–5.3)
Sodium: 142 mmol/L (ref 135–146)
Total Bilirubin: 0.6 mg/dL (ref 0.2–1.2)
Total Protein: 6.8 g/dL (ref 6.1–8.1)
eGFR: 99 mL/min/{1.73_m2} (ref >=60)

## 2022-07-14 LAB — SEDIMENTATION RATE: Sed Rate: 14 mm/h (ref 0–30)

## 2022-07-14 LAB — QUANTIFERON-TB GOLD PLUS
Mitogen-NIL: 8.12 IU/mL
NIL: 0.04 IU/mL
QuantiFERON-TB Gold Plus: NEGATIVE
TB1-NIL: <0 IU/mL
TB2-NIL: <0 IU/mL

## 2022-07-14 LAB — RHEUMATOID FACTOR: Rheumatoid fact SerPl-aCnc: 136 IU/mL — ABNORMAL HIGH (ref <14)

## 2022-07-14 LAB — CYCLIC CITRUL PEPTIDE ANTIBODY, IGG: Cyclic Citrullin Peptide Ab: 233 UNITS — ABNORMAL HIGH

## 2022-07-14 NOTE — Progress Notes (Signed)
Rheumatoid factor and anti-CCP are positive consistent with rheumatoid arthritis.  CMP is normal, sed rate is normal, TB Gold is negative.  Labs are consistent with rheumatoid arthritis.  I will discuss results at the follow-up visit.

## 2022-07-14 NOTE — Therapy (Signed)
OUTPATIENT PHYSICAL THERAPY THORACOLUMBAR EVALUATION   Patient Name: Amy Mccarthy MRN: [id number] DOB:[birth year], 56 y.o., female Today's Date: 07/18/2022   PT End of Session - 07/18/22 1028     Visit Number 1    Number of Visits 6    Date for PT Re-Evaluation 08/29/22    Authorization Type Generic [employer]; no deduct, no co-pay, 35 lisit VL no auth required    Authorization - Number of Visits 35    PT Start Time [phone]    PT Stop Time 1025    PT Time Calculation (min) 32 min             Past Medical History:  Diagnosis Date   Arthritis    Rheumatoid arthritis(714.0) 8/13   Right shoulder pain    Shoulder pain, left    Thyroid disease    Past Surgical History:  Procedure Laterality Date   CERVICAL DISC SURGERY     Patient Active Problem List   Diagnosis Date Noted   Rheumatoid arthritis 11/15/2016   High risk medication use 11/15/2016   Fibromyalgia 11/15/2016   Psoriasis 11/15/2016   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   Smoker 11/15/2016   Hypothyroidism 11/15/2016    PCP: Elfredia Nevins, MD  REFERRING PROVIDER: PT eval/tx for M54.59 other LBP per Arlyce Harman, DO   REFERRING DIAG: PT eval/tx for M54.59 other LBP per Arlyce Harman, DO   Rationale for Evaluation and Treatment Rehabilitation  THERAPY DIAG:  Low back pain, unspecified back pain laterality, unspecified chronicity, unspecified whether sciatica present  Abnormal posture  ONSET DATE: over a year  SUBJECTIVE:                                                                                                                                                                                           SUBJECTIVE STATEMENT: Increased Low back pain for the past year or so. She is standing all day on cement flooring for her job. Sleeping on love seat currently PERTINENT HISTORY:  Low back pain chronic for the past 20 years Rheumatoid arthritis Right knee pain; recent injection last  Monday Neck fusion per Dr. Venetia Maxon over 25 years ago  PAIN:  Are you having pain? Yes: NPRS scale: 2-10/10 Pain location: low back and across back Pain description: aching burning, stabbing Aggravating factors: prolonged standing, bending Relieving factors: Voltaren, rest   PRECAUTIONS: None  WEIGHT BEARING RESTRICTIONS No  FALLS:  Has patient fallen in last 6 months? No  LIVING ENVIRONMENT: Lives with: lives with an adult companion Lives in: House/apartment Stairs: Yes: External: 2 steps; on right going up Has  following equipment at home: Grab bars  OCCUPATION: hairdresser  PLOF: Independent  PATIENT GOALS eliminate pain   OBJECTIVE:   DIAGNOSTIC FINDINGS:  Has had x-rays of feet and R knee and hands per Dr. Jon Billings  PATIENT SURVEYS:  FOTO 51    COGNITION:  Overall cognitive status: Within functional limits for tasks assessed     SENSATION: WFL  eg  POSTURE: decreased lumbar lordosis  LUMBAR ROM:   Active  A/PROM  eval  Flexion 80% available  Extension 25% available  Right lateral flexion Fingertips 3" above knee  Left lateral flexion Fingertips 3" above knee  Right rotation 50% available  Left rotation 50% available   (Blank rows = not tested)  LOWER EXTREMITY MMT:    MMT Right eval Left eval  Hip flexion 4 4+  Hip extension    Hip abduction    Hip adduction    Hip internal rotation    Hip external rotation    Knee flexion    Knee extension 5 5  Ankle dorsiflexion 5 5  Ankle plantarflexion    Ankle inversion    Ankle eversion     (Blank rows = not tested)     GAIT: Distance walked: 50 Assistive device utilized: None Level of assistance: Modified independence Comments: increased trunk sway    TODAY'S TREATMENT  Physical therapy evaluation, HEP instruction   PATIENT EDUCATION:  Education details: Patient educated on exam findings, POC, scope of PT, HEP. Person educated: Patient Education method: Explanation,  Demonstration, and Handouts Education comprehension: verbalized understanding, returned demonstration, verbal cues required, and tactile cues required  HOME EXERCISE PROGRAM: Access Code: [email or web address] URL: [email or web address] Date: 07/18/2022 Prepared by: [facility] - Rehab  Exercises - Supine Bridge  - 2 x daily - 7 x weekly - 1 sets - 10 reps - Supine Lower Trunk Rotation  - 2 x daily - 7 x weekly - 1 sets - 10 reps - Supine Hamstring Stretch  - 2 x daily - 7 x weekly - 1 sets - 10 reps  ASSESSMENT:  CLINICAL IMPRESSION: Patient is a 56 y.o. female who was seen today for physical therapy evaluation and treatment for chronic low back pain. 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 Patient will benefit from skilled therapy interventions to address deficits and promote optimal function.    OBJECTIVE IMPAIRMENTS Abnormal gait, decreased activity tolerance, decreased endurance, decreased mobility, difficulty walking, decreased ROM, decreased strength, hypomobility, increased fascial restrictions, impaired perceived functional ability, impaired flexibility, postural dysfunction, and pain.   ACTIVITY LIMITATIONS carrying, lifting, bending, sitting, standing, squatting, sleeping, stairs, transfers, bed mobility, reach over head, hygiene/grooming, and locomotion level  PARTICIPATION LIMITATIONS: meal prep, cleaning, laundry, driving, shopping, community activity, occupation, and yard work    [facility] POTENTIAL: Good  CLINICAL DECISION MAKING: Stable/uncomplicated  EVALUATION COMPLEXITY: Low   GOALS: Goals reviewed with patient? No  SHORT TERM GOALS: Target date: 08/08/2022   patient will be independent with initial HEP  Baseline: Goal status: INITIAL  2.  Patient will be able  to sleep in bed x 2 hours without pain waking her Baseline:  Goal status: INITIAL  LONG TERM GOALS: Target date: 08/29/2022  Patient will be independent in self management strategies to improve quality of life and functional outcomes.  Baseline:  Goal status: INITIAL  2.  Patient will report at least 50% improvement in overall symptoms and/or function to demonstrate improved functional mobility  Baseline:  Goal status: INITIAL  3.  Patient will improve FOTO score to predicted value to demonstrate improved functional mobility  Baseline: 51 Goal status: INITIAL  4.  Patient will be able to stand x 1 hour to cut hair without pain more than 3/10 to improve ability to perform her job Baseline:  Goal status: INITIAL   PLAN: PT FREQUENCY: 1x/week  PT DURATION: 6 weeks  PLANNED INTERVENTIONS: Therapeutic exercises, Therapeutic activity, Neuromuscular re-education, Balance training, Gait training, Patient/Family education, Joint manipulation, Joint mobilization, Stair training, Orthotic/Fit training, DME instructions, Aquatic Therapy, Dry Needling, Electrical stimulation, Spinal manipulation, Spinal mobilization, Cryotherapy, Moist heat, Compression bandaging, scar mobilization, Splintting, Taping, Traction, Ultrasound, Ionotophoresis 4mg /ml Dexamethasone, and Manual therapy   PLAN FOR NEXT SESSION: Review of HEP and goals.    10:51 AM, 07/18/22 Ki Corbo Small Keierra Nudo MPT [place] [facility] [place] [phone]

## 2022-07-18 ENCOUNTER — Ambulatory Visit (HOSPITAL_COMMUNITY): Payer: 59 | Attending: Family Medicine

## 2022-07-18 DIAGNOSIS — M545 Low back pain, unspecified: Secondary | ICD-10-CM | POA: Insufficient documentation

## 2022-07-18 DIAGNOSIS — R293 Abnormal posture: Secondary | ICD-10-CM | POA: Diagnosis not present

## 2022-07-25 DIAGNOSIS — A6922 Other neurologic disorders in Lyme disease: Secondary | ICD-10-CM | POA: Diagnosis not present

## 2022-07-25 DIAGNOSIS — Z1331 Encounter for screening for depression: Secondary | ICD-10-CM | POA: Diagnosis not present

## 2022-07-25 DIAGNOSIS — Z6841 Body Mass Index (BMI) 40.0 and over, adult: Secondary | ICD-10-CM | POA: Diagnosis not present

## 2022-07-25 DIAGNOSIS — L309 Dermatitis, unspecified: Secondary | ICD-10-CM | POA: Diagnosis not present

## 2022-07-25 DIAGNOSIS — M069 Rheumatoid arthritis, unspecified: Secondary | ICD-10-CM | POA: Diagnosis not present

## 2022-07-25 DIAGNOSIS — E063 Autoimmune thyroiditis: Secondary | ICD-10-CM | POA: Diagnosis not present

## 2022-07-25 DIAGNOSIS — M5136 Other intervertebral disc degeneration, lumbar region: Secondary | ICD-10-CM | POA: Diagnosis not present

## 2022-07-25 DIAGNOSIS — Z0001 Encounter for general adult medical examination with abnormal findings: Secondary | ICD-10-CM | POA: Diagnosis not present

## 2022-08-15 ENCOUNTER — Ambulatory Visit (HOSPITAL_COMMUNITY): Payer: 59 | Attending: Family Medicine

## 2022-08-15 DIAGNOSIS — R293 Abnormal posture: Secondary | ICD-10-CM | POA: Insufficient documentation

## 2022-08-15 DIAGNOSIS — M545 Low back pain, unspecified: Secondary | ICD-10-CM | POA: Insufficient documentation

## 2022-08-15 NOTE — Therapy (Signed)
OUTPATIENT PHYSICAL THERAPY THORACOLUMBAR EVALUATION   Patient Name: Amy Mccarthy MRN: [id number] DOB:[birth year], 56 y.o., female Today's Date: 08/15/2022   PT End of Session - 08/15/22 0957     Visit Number 2    Number of Visits 6    Date for PT Re-Evaluation 08/29/22    Authorization Type Generic [employer]; no deduct, no co-pay, 35 lisit VL no auth required    Authorization - Number of Visits 26    PT Start Time [phone]   late check in   PT Stop Time 1030    PT Time Calculation (min) 34 min              Past Medical History:  Diagnosis Date   Arthritis    Rheumatoid arthritis(714.0) 8/13   Right shoulder pain    Shoulder pain, left    Thyroid disease    Past Surgical History:  Procedure Laterality Date   CERVICAL DISC SURGERY     Patient Active Problem List   Diagnosis Date Noted   Rheumatoid arthritis 11/15/2016   High risk medication use 11/15/2016   Fibromyalgia 11/15/2016   Psoriasis 11/15/2016   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   Smoker 11/15/2016   Hypothyroidism 11/15/2016    PCP: Redmond School, MD  REFERRING PROVIDER: PT eval/tx for M54.59 other LBP per Nuala Alpha, DO   REFERRING DIAG: PT eval/tx for M54.59 other LBP per Nuala Alpha, DO   Rationale for Evaluation and Treatment Rehabilitation  THERAPY DIAG:  Low back pain, unspecified back pain laterality, unspecified chronicity, unspecified whether sciatica present  Abnormal posture  ONSET DATE: over a year  SUBJECTIVE:                                                                                                                                                                                           SUBJECTIVE STATEMENT: Doing a little better but has not been doing her exercises; 2/10 pain today; has been taking it a little lighter at work  PERTINENT HISTORY:  Low back pain chronic for the past 20 years Rheumatoid arthritis Right knee pain; recent injection  last Monday Neck fusion per Dr. Vertell Limber over 25 years ago  PAIN:  Are you having pain? Yes: NPRS scale: 2/10 Pain location: low back and across back Pain description: aching burning, stabbing Aggravating factors: prolonged standing, bending Relieving factors: Voltaren, rest   PRECAUTIONS: None  WEIGHT BEARING RESTRICTIONS No  FALLS:  Has patient fallen in last 6 months? No  LIVING ENVIRONMENT: Lives with: lives with an adult companion Lives in: House/apartment Stairs: Yes: External: 2 steps; on right  going up Has following equipment at home: Grab bars  OCCUPATION: hairdresser  PLOF: Independent  PATIENT GOALS eliminate pain   OBJECTIVE:   DIAGNOSTIC FINDINGS:  Has had x-rays of feet and R knee and hands per Dr. Jon Billings  PATIENT SURVEYS:  FOTO 51    COGNITION:  Overall cognitive status: Within functional limits for tasks assessed     SENSATION: WFL  eg  POSTURE: decreased lumbar lordosis  LUMBAR ROM:   Active  A/PROM  eval  Flexion 80% available  Extension 25% available  Right lateral flexion Fingertips 3" above knee  Left lateral flexion Fingertips 3" above knee  Right rotation 50% available  Left rotation 50% available   (Blank rows = not tested)  LOWER EXTREMITY MMT:    MMT Right eval Left eval  Hip flexion 4 4+  Hip extension    Hip abduction    Hip adduction    Hip internal rotation    Hip external rotation    Knee flexion    Knee extension 5 5  Ankle dorsiflexion 5 5  Ankle plantarflexion    Ankle inversion    Ankle eversion     (Blank rows = not tested)     GAIT: Distance walked: 50 Assistive device utilized: None Level of assistance: Modified independence Comments: increased trunk sway    TODAY'S TREATMENT  08/15/22 Review of HEP and goals   Supine: LTR x 10 Hamstring stretch 3 x 20" bridge x 10 Bridge with ball squeeze for hip adduction x 10 Bridge with hip abduction with belt x 10  Sidelying clam x 10  each      eval Physical therapy evaluation, HEP instruction   PATIENT EDUCATION:  Education details: Patient educated on exam findings, POC, scope of PT, HEP. Person educated: Patient Education method: Explanation, Demonstration, and Handouts Education comprehension: verbalized understanding, returned demonstration, verbal cues required, and tactile cues required  HOME EXERCISE PROGRAM: Access Code: [email or web address] URL: [email or web address] Date: 07/18/2022 Prepared by: [facility] - Rehab  Exercises - Supine Bridge  - 2 x daily - 7 x weekly - 1 sets - 10 reps - Supine Lower Trunk Rotation  - 2 x daily - 7 x weekly - 1 sets - 10 reps - Supine Hamstring Stretch  - 2 x daily - 7 x weekly - 1 sets - 10 reps  ASSESSMENT:  CLINICAL IMPRESSION: Today's session started with review of HEP and goals.  Patient verbalizes agreement with set rehab goals.  Progressed bridge exercises and added clam for glute and hamstring strengthening.  Patient will benefit from skilled therapy interventions to address deficits and promote optimal function.    OBJECTIVE IMPAIRMENTS Abnormal gait, decreased activity tolerance, decreased endurance, decreased mobility, difficulty walking, decreased ROM, decreased strength, hypomobility, increased fascial restrictions, impaired perceived functional ability, impaired flexibility, postural dysfunction, and pain.   ACTIVITY LIMITATIONS carrying, lifting, bending, sitting, standing, squatting, sleeping, stairs, transfers, bed mobility, reach over head, hygiene/grooming, and locomotion level  PARTICIPATION LIMITATIONS: meal prep, cleaning, laundry, driving, shopping, community activity, occupation, and yard work    [facility] POTENTIAL: Good  CLINICAL DECISION MAKING: Stable/uncomplicated  EVALUATION COMPLEXITY: Low   GOALS: Goals reviewed with patient? Yes  SHORT TERM GOALS: Target date: 08/08/2022   patient will be independent with initial  HEP  Baseline: Goal status: IN PROGRESS  2.  Patient will be able to sleep in bed x 2 hours without pain waking her Baseline:  Goal status: IN PROGRESS  LONG TERM GOALS: Target date: 08/29/2022  Patient will be independent in self management strategies to improve quality of life and functional outcomes.  Baseline:  Goal status: IN PROGRESS  2.  Patient will report at least 50% improvement in overall symptoms and/or function to demonstrate improved functional mobility  Baseline:  Goal status: IN PROGRESS  3.  Patient will improve FOTO score to predicted value to demonstrate improved functional mobility  Baseline: 51 Goal status: IN PROGRESS  4.  Patient will be able to stand x 1 hour to cut hair without pain more than 3/10 to improve ability to perform her job Baseline:  Goal status: IN PROGRESS   PLAN: PT FREQUENCY: 1x/week  PT DURATION: 6 weeks  PLANNED INTERVENTIONS: Therapeutic exercises, Therapeutic activity, Neuromuscular re-education, Balance training, Gait training, Patient/Family education, Joint manipulation, Joint mobilization, Stair training, Orthotic/Fit training, DME instructions, Aquatic Therapy, Dry Needling, Electrical stimulation, Spinal manipulation, Spinal mobilization, Cryotherapy, Moist heat, Compression bandaging, scar mobilization, Splintting, Taping, Traction, Ultrasound, Ionotophoresis 4mg /ml Dexamethasone, and Manual therapy   PLAN FOR NEXT SESSION: progress core stabilization and postural strengthening as able.   10:30 AM, 08/15/22 Shai Mckenzie Small Kendelle Schweers MPT [facility] [place] [phone]

## 2022-08-22 ENCOUNTER — Ambulatory Visit (HOSPITAL_COMMUNITY): Payer: 59

## 2022-08-22 DIAGNOSIS — M545 Low back pain, unspecified: Secondary | ICD-10-CM

## 2022-08-22 DIAGNOSIS — R293 Abnormal posture: Secondary | ICD-10-CM | POA: Diagnosis not present

## 2022-08-22 NOTE — Therapy (Addendum)
OUTPATIENT PHYSICAL THERAPY THORACOLUMBAR EVALUATION   Patient Name: Amy Mccarthy MRN: [id number] DOB:22-Mar-1966, 56 y.o., female Today's Date: 08/22/2022   PT End of Session - 08/22/22 0950     Visit Number 3    Number of Visits 6    Date for PT Re-Evaluation 08/29/22    Authorization Type Generic [employer]; no deduct, no co-pay, 35 lisit VL no auth required    Authorization - Number of Visits 35    PT Start Time 0949    PT Stop Time 1029    PT Time Calculation (min) 40 min               Past Medical History:  Diagnosis Date   Arthritis    Rheumatoid arthritis(714.0) 8/13   Right shoulder pain    Shoulder pain, left    Thyroid disease    Past Surgical History:  Procedure Laterality Date   CERVICAL DISC SURGERY     Patient Active Problem List   Diagnosis Date Noted   Rheumatoid arthritis 11/15/2016   High risk medication use 11/15/2016   Fibromyalgia 11/15/2016   Psoriasis 11/15/2016   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   Smoker 11/15/2016   Hypothyroidism 11/15/2016    PCP: Elfredia Nevins, MD  REFERRING PROVIDER: PT eval/tx for M54.59 other LBP per Arlyce Harman, DO   REFERRING DIAG: PT eval/tx for M54.59 other LBP per Arlyce Harman, DO   Rationale for Evaluation and Treatment Rehabilitation  THERAPY DIAG:  Low back pain, unspecified back pain laterality, unspecified chronicity, unspecified whether sciatica present  Abnormal posture  ONSET DATE: over a year  SUBJECTIVE:                                                                                                                                                                                           SUBJECTIVE STATEMENT: Right knee occasionally bothers her.  Back is about the same; 2/10 today; reports bridge seemed to flare her knee up.  PERTINENT HISTORY:  Low back pain chronic for the past 20 years Rheumatoid arthritis Right knee pain; recent injection last Monday Neck  fusion per Dr. Venetia Maxon over 25 years ago  PAIN:  Are you having pain? Yes: NPRS scale: 2/10 Pain location: low back and across back Pain description: aching burning, stabbing Aggravating factors: prolonged standing, bending Relieving factors: Voltaren, rest   PRECAUTIONS: None  WEIGHT BEARING RESTRICTIONS No  FALLS:  Has patient fallen in last 6 months? No  LIVING ENVIRONMENT: Lives with: lives with an adult companion Lives in: House/apartment Stairs: Yes: External: 2 steps; on right going up Has following equipment  at home: Grab bars  OCCUPATION: hairdresser  PLOF: Independent  PATIENT GOALS eliminate pain   OBJECTIVE:   DIAGNOSTIC FINDINGS:  Has had x-rays of feet and R knee and hands per Dr. Jon Billings  PATIENT SURVEYS:  FOTO 51    COGNITION:  Overall cognitive status: Within functional limits for tasks assessed     SENSATION: WFL  eg  POSTURE: decreased lumbar lordosis  LUMBAR ROM:   Active  A/PROM  eval  Flexion 80% available  Extension 25% available  Right lateral flexion Fingertips 3" above knee  Left lateral flexion Fingertips 3" above knee  Right rotation 50% available  Left rotation 50% available   (Blank rows = not tested)  LOWER EXTREMITY MMT:    MMT Right eval Left eval  Hip flexion 4 4+  Hip extension    Hip abduction    Hip adduction    Hip internal rotation    Hip external rotation    Knee flexion    Knee extension 5 5  Ankle dorsiflexion 5 5  Ankle plantarflexion    Ankle inversion    Ankle eversion     (Blank rows = not tested)     GAIT: Distance walked: 50 Assistive device utilized: None Level of assistance: Modified independence Comments: increased trunk sway    TODAY'S TREATMENT  08/22/22  Supine: LTR x 10 Hamstring stretch 5 x 20" Clam 2 x 10  Prone: Glute sets 5" hold x 10 Hip ext 2 x 10 each  Standing: Scapular retractions RTB 2 x 10 Shoulder extensions RTB 2 x 10 Doorway stretch 10" x  10      08/15/22 Review of HEP and goals   Supine: LTR x 10 Hamstring stretch 3 x 20" bridge x 10 Bridge with ball squeeze for hip adduction x 10 Bridge with hip abduction with belt x 10  Sidelying clam x 10 each      eval Physical therapy evaluation, HEP instruction   PATIENT EDUCATION:  Education details: Patient educated on exam findings, POC, scope of PT, HEP. Person educated: Patient Education method: Explanation, Demonstration, and Handouts Education comprehension: verbalized understanding, returned demonstration, verbal cues required, and tactile cues required  HOME EXERCISE PROGRAM: Access Code: [email or web address] URL: [email or web address] Date: 08/22/2022 Prepared by: [facility] - Rehab  Exercises - Prone Gluteal Sets  - 1 x daily - 7 x weekly - 2 sets - 10 reps - Prone Hip Extension  - 1 x daily - 7 x weekly - 2 sets - 10 reps - Scapular Retraction with Resistance  - 1 x daily - 7 x weekly - 2 sets - 10 reps - Shoulder Extension with Resistance  - 1 x daily - 7 x weekly - 2 sets - 10 reps  Access Code: [email or web address] URL: [email or web address] Date: 07/18/2022 Prepared by: [facility] - Rehab  Exercises - Supine Bridge  - 2 x daily - 7 x weekly - 1 sets - 10 reps - Supine Lower Trunk Rotation  - 2 x daily - 7 x weekly - 1 sets - 10 reps - Supine Hamstring Stretch  - 2 x daily - 7 x weekly - 1 sets - 10 reps  ASSESSMENT:  CLINICAL IMPRESSION: Today's session focused on lumbar mobility and core strength. Held bridge today due to right knee pain. Added prone exercise today without issue. Progressed to standing exercise; patient needed occasional cues to avoid elevating shoulders with extension and rows.  updated HEP.issued red thera band for home use. Patient will benefit from  skilled therapy interventions to address deficits and promote optimal function.    OBJECTIVE IMPAIRMENTS Abnormal gait, decreased activity tolerance, decreased endurance, decreased  mobility, difficulty walking, decreased ROM, decreased strength, hypomobility, increased fascial restrictions, impaired perceived functional ability, impaired flexibility, postural dysfunction, and pain.   ACTIVITY LIMITATIONS carrying, lifting, bending, sitting, standing, squatting, sleeping, stairs, transfers, bed mobility, reach over head, hygiene/grooming, and locomotion level  PARTICIPATION LIMITATIONS: meal prep, cleaning, laundry, driving, shopping, community activity, occupation, and yard work    [employer] POTENTIAL: Good  CLINICAL DECISION MAKING: Stable/uncomplicated  EVALUATION COMPLEXITY: Low   GOALS: Goals reviewed with patient? Yes  SHORT TERM GOALS: Target date: 08/08/2022   patient will be independent with initial HEP  Baseline: Goal status: IN PROGRESS  2.  Patient will be able to sleep in bed x 2 hours without pain waking her Baseline:  Goal status: IN PROGRESS  LONG TERM GOALS: Target date: 08/29/2022  Patient will be independent in self management strategies to improve quality of life and functional outcomes.  Baseline:  Goal status: IN PROGRESS  2.  Patient will report at least 50% improvement in overall symptoms and/or function to demonstrate improved functional mobility  Baseline:  Goal status: IN PROGRESS  3.  Patient will improve FOTO score to predicted value to demonstrate improved functional mobility  Baseline: 51 Goal status: IN PROGRESS  4.  Patient will be able to stand x 1 hour to cut hair without pain more than 3/10 to improve ability to perform her job Baseline:  Goal status: IN PROGRESS   PLAN: PT FREQUENCY: 1x/week  PT DURATION: 6 weeks  PLANNED INTERVENTIONS: Therapeutic exercises, Therapeutic activity, Neuromuscular re-education, Balance training, Gait training, Patient/Family education, Joint manipulation, Joint mobilization, Stair training, Orthotic/Fit training, DME instructions, Aquatic Therapy, Dry Needling, Electrical  stimulation, Spinal manipulation, Spinal mobilization, Cryotherapy, Moist heat, Compression bandaging, scar mobilization, Splintting, Taping, Traction, Ultrasound, Ionotophoresis 4mg /ml Dexamethasone, and Manual therapy   PLAN FOR NEXT SESSION: progress core stabilization and postural strengthening as able.   9:51 AM, 08/22/22 Kenedy Haisley Small Kailen Name MPT [facility] [place] [phone]

## 2022-08-29 ENCOUNTER — Encounter (HOSPITAL_COMMUNITY): Payer: Self-pay | Admitting: Physical Therapy

## 2022-08-29 ENCOUNTER — Ambulatory Visit (HOSPITAL_COMMUNITY): Payer: 59 | Admitting: Physical Therapy

## 2022-08-29 DIAGNOSIS — R293 Abnormal posture: Secondary | ICD-10-CM | POA: Diagnosis not present

## 2022-08-29 DIAGNOSIS — M545 Low back pain, unspecified: Secondary | ICD-10-CM | POA: Diagnosis not present

## 2022-08-29 NOTE — Therapy (Signed)
OUTPATIENT PHYSICAL THERAPY THORACOLUMBAR TREATMENT   Patient Name: Amy Mccarthy MRN: [id number] DOB:02/04/1966, 56 y.o., female Today's Date: 08/29/2022  PHYSICAL THERAPY DISCHARGE SUMMARY  Visits from Start of Care: 4  Current functional level related to goals / functional outcomes: See below   Remaining deficits: See below   Education / Equipment: See below   Patient agrees to discharge. Patient goals were partially met. Patient is being discharged due to being pleased with the current functional level.    PT End of Session - 08/29/22 0959     Visit Number 4    Number of Visits 6    Date for PT Re-Evaluation 08/29/22    Authorization Type Generic [employer]; no deduct, no co-pay, 35 lisit VL no auth required    Authorization - Number of Visits 35    PT Start Time 1000   arrived late/delayed check in   PT Stop Time 1020    PT Time Calculation (min) 20 min    Activity Tolerance Patient tolerated treatment well    Behavior During Therapy [facility] for tasks assessed/performed               Past Medical History:  Diagnosis Date   Arthritis    Rheumatoid arthritis(714.0) 8/13   Right shoulder pain    Shoulder pain, left    Thyroid disease    Past Surgical History:  Procedure Laterality Date   CERVICAL DISC SURGERY     Patient Active Problem List   Diagnosis Date Noted   Rheumatoid arthritis 11/15/2016   High risk medication use 11/15/2016   Fibromyalgia 11/15/2016   Psoriasis 11/15/2016   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   Smoker 11/15/2016   Hypothyroidism 11/15/2016    PCP: Redmond School, MD  REFERRING PROVIDER: PT eval/tx for M54.59 other LBP per Nuala Alpha, DO   REFERRING DIAG: PT eval/tx for M54.59 other LBP per Nuala Alpha, DO   Rationale for Evaluation and Treatment Rehabilitation  THERAPY DIAG:  Low back pain, unspecified back pain laterality, unspecified chronicity, unspecified whether sciatica present  Abnormal  posture  ONSET DATE: over a year  SUBJECTIVE:                                                                                                                                                                                           SUBJECTIVE STATEMENT: Patient states she overall feels about the same, but some better when she is working. Patient has been doing some home exercises. Feels like pain skips around. Bicep is sore today.   PERTINENT HISTORY:  Low back pain chronic for the past  20 years Rheumatoid arthritis Right knee pain; recent injection last Monday Neck fusion per Dr. Vertell Limber over 25 years ago  PAIN:  Are you having pain? Yes: NPRS scale: 2-3/10 Pain location: low back and across back Pain description: aching burning, stabbing Aggravating factors: prolonged standing, bending Relieving factors: Voltaren, rest   PRECAUTIONS: None  WEIGHT BEARING RESTRICTIONS No  FALLS:  Has patient fallen in last 6 months? No  LIVING ENVIRONMENT: Lives with: lives with an adult companion Lives in: House/apartment Stairs: Yes: External: 2 steps; on right going up Has following equipment at home: Grab bars  OCCUPATION: hairdresser  PLOF: Independent  PATIENT GOALS eliminate pain   OBJECTIVE:   DIAGNOSTIC FINDINGS:  Has had x-rays of feet and R knee and hands per Dr. Garen Grams  PATIENT SURVEYS:  FOTO 51 08/29/22: 55% function    COGNITION:  Overall cognitive status: Within functional limits for tasks assessed     SENSATION: [facility]  eg  POSTURE: decreased lumbar lordosis  LUMBAR ROM:   Active  A/PROM  eval 08/29/22  Flexion 80% available 80% available  Extension 25% available 25% available  Right lateral flexion Fingertips 3" above knee Fingertips 2" above knee  Left lateral flexion Fingertips 3" above knee Fingertips 2" above knee  Right rotation 50% available 50% available  Left rotation 50% available 50% available   (Blank rows = not tested)  LOWER  EXTREMITY MMT:    MMT Right eval Left eval Right 08/29/22 Left  08/29/22  Hip flexion 4 4+ 4+ 4+  Hip extension      Hip abduction      Hip adduction      Hip internal rotation      Hip external rotation      Knee flexion      Knee extension 5 5 5 5   Ankle dorsiflexion 5 5 5 5   Ankle plantarflexion      Ankle inversion      Ankle eversion       (Blank rows = not tested)     GAIT: Distance walked: 50 Assistive device utilized: None Level of assistance: Modified independence Comments: increased trunk sway    TODAY'S TREATMENT  08/29/22 Reassessment  08/22/22  Supine: LTR x 10 Hamstring stretch 5 x 20" Clam 2 x 10  Prone: Glute sets 5" hold x 10 Hip ext 2 x 10 each  Standing: Scapular retractions RTB 2 x 10 Shoulder extensions RTB 2 x 10 Doorway stretch 10" x 10  08/15/22 Review of HEP and goals   Supine: LTR x 10 Hamstring stretch 3 x 20" bridge x 10 Bridge with ball squeeze for hip adduction x 10 Bridge with hip abduction with belt x 10  Sidelying clam x 10 each  eval Physical therapy evaluation, HEP instruction   PATIENT EDUCATION:  Education details: Patient educated on exam findings, POC, scope of PT, HEP. Person educated: Patient Education method: Explanation, Demonstration, and Handouts Education comprehension: verbalized understanding, returned demonstration, verbal cues required, and tactile cues required  HOME EXERCISE PROGRAM: Access Code: [email or web address] URL: [email or web address] Date: 08/22/2022 Prepared by: AP - Rehab  Exercises - Prone Gluteal Sets  - 1 x daily - 7 x weekly - 2 sets - 10 reps - Prone Hip Extension  - 1 x daily - 7 x weekly - 2 sets - 10 reps - Scapular Retraction with Resistance  - 1 x daily - 7 x weekly - 2 sets - 10 reps - Shoulder Extension with Resistance  - 1  x daily - 7 x weekly - 2 sets - 10 reps  Access Code: [email or web address] URL: [email or web address] Date: 07/18/2022 Prepared by:  AP - Rehab  Exercises - Supine Bridge  - 2 x daily - 7 x weekly - 1 sets - 10 reps - Supine Lower Trunk Rotation  - 2 x daily - 7 x weekly - 1 sets - 10 reps - Supine Hamstring Stretch  - 2 x daily - 7 x weekly - 1 sets - 10 reps  ASSESSMENT:  CLINICAL IMPRESSION: Session limited to late arrival. Patient has met 2/2 short term and 2/4 long term goals with ability to complete HEP and improvement in symptoms and functional mobility. Remaining goals not met due to continued symptoms, impaired activity tolerance and functional mobility. Patient has made minimal progress in strength and ROM since beginning PT. This progress is likely limited by lack of PT sessions during POC and decreased performance of HEP. Patient educated on reassessment findings and more consistent completion of HEP in order to improve further. Patient unsure if wanting to d/c or continue PT but is overall agreeable to d/c to HEP. Patient educated on returning to PT if wishing to pursue further intervention. Patient discharged from physical therapy at this time.    OBJECTIVE IMPAIRMENTS Abnormal gait, decreased activity tolerance, decreased endurance, decreased mobility, difficulty walking, decreased ROM, decreased strength, hypomobility, increased fascial restrictions, impaired perceived functional ability, impaired flexibility, postural dysfunction, and pain.   ACTIVITY LIMITATIONS carrying, lifting, bending, sitting, standing, squatting, sleeping, stairs, transfers, bed mobility, reach over head, hygiene/grooming, and locomotion level  PARTICIPATION LIMITATIONS: meal prep, cleaning, laundry, driving, shopping, community activity, occupation, and yard work    [employer] POTENTIAL: Good  CLINICAL DECISION MAKING: Stable/uncomplicated  EVALUATION COMPLEXITY: Low   GOALS: Goals reviewed with patient? Yes  SHORT TERM GOALS: Target date: 08/08/2022   patient will be independent with initial HEP  Baseline: Goal status:  MET  2.  Patient will be able to sleep in bed x 2 hours without pain waking her Baseline: 9/25 not pain waking her up Goal status: MET  LONG TERM GOALS: Target date: 08/29/2022  Patient will be independent in self management strategies to improve quality of life and functional outcomes.  Baseline:  Goal status: NOT MET  2.  Patient will report at least 50% improvement in overall symptoms and/or function to demonstrate improved functional mobility  Baseline: 08/29/22: 90% improvement Goal status: MET  3.  Patient will improve FOTO score to predicted value to demonstrate improved functional mobility  Baseline: 51 9/25 55% function Goal status: NOT MET  4.  Patient will be able to stand x 1 hour to cut hair without pain more than 3/10 to improve ability to perform her job Baseline: 08/29/22: able Goal status: MET   PLAN: PT FREQUENCY: 1x/week  PT DURATION: 6 weeks  PLANNED INTERVENTIONS: Therapeutic exercises, Therapeutic activity, Neuromuscular re-education, Balance training, Gait training, Patient/Family education, Joint manipulation, Joint mobilization, Stair training, Orthotic/Fit training, DME instructions, Aquatic Therapy, Dry Needling, Electrical stimulation, Spinal manipulation, Spinal mobilization, Cryotherapy, Moist heat, Compression bandaging, scar mobilization, Splintting, Taping, Traction, Ultrasound, Ionotophoresis 4mg /ml Dexamethasone, and Manual therapy   PLAN FOR NEXT SESSION: n/a  10:20 AM, 08/29/22 Mearl Latin PT, DPT Physical Therapist at [facility]

## 2022-09-02 NOTE — Progress Notes (Deleted)
Office Visit Note  Patient: Amy Mccarthy             Date of Birth: 1966/09/02           MRN: [id number]             PCP: Redmond School, MD Referring: Redmond School, MD Visit Date: 09/16/2022 Occupation: @GUAROCC @  Subjective:  No chief complaint on file.   History of Present Illness: Amy Mccarthy is a 56 y.o. female ***   Activities of Daily Living:  Patient reports morning stiffness for *** {minute/hour:19697}.   Patient {ACTIONS;DENIES/REPORTS:21021675::"Denies"} nocturnal pain.  Difficulty dressing/grooming: {ACTIONS;DENIES/REPORTS:21021675::"Denies"} Difficulty climbing stairs: {ACTIONS;DENIES/REPORTS:21021675::"Denies"} Difficulty getting out of chair: {ACTIONS;DENIES/REPORTS:21021675::"Denies"} Difficulty using hands for taps, buttons, cutlery, and/or writing: {ACTIONS;DENIES/REPORTS:21021675::"Denies"}  No Rheumatology ROS completed.   PMFS History:  Patient Active Problem List   Diagnosis Date Noted  . Rheumatoid arthritis 11/15/2016  . High risk medication use 11/15/2016  . Fibromyalgia 11/15/2016  . Psoriasis 11/15/2016  . 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 Smoker 11/15/2016  . Hypothyroidism 11/15/2016    Past Medical History:  Diagnosis Date  . Arthritis   . Rheumatoid arthritis(714.0) 8/13  . Right shoulder pain   . Shoulder pain, left   . Thyroid disease     Family History  Problem Relation Age of Onset  . Hypothyroidism Mother   . Hypothyroidism Maternal Grandmother   . Diabetes Father   . COPD Father   . Emphysema Brother   . COPD Brother    Past Surgical History:  Procedure Laterality Date  . CERVICAL DISC SURGERY     Social History   Social History Narrative  . Not on file    There is no immunization history on file for this patient.   Objective: Vital Signs: LMP 09/06/2011    Physical Exam   Musculoskeletal Exam: ***  CDAI Exam: CDAI Score: -- Patient Global: --; Provider Global: -- Swollen:  --; Tender: -- Joint Exam 09/16/2022   No joint exam has been documented for this visit   There is currently no information documented on the homunculus. Go to the Rheumatology activity and complete the homunculus joint exam.  Investigation: No additional findings.  Imaging: No results found.  Recent Labs: Lab Results  Component Value Date   WBC 6.7 06/11/2022   HGB 13.1 06/11/2022   PLT 268 06/11/2022   NA 142 07/11/2022   K 4.2 07/11/2022   CL 105 07/11/2022   CO2 28 07/11/2022   GLUCOSE 80 07/11/2022   BUN 12 07/11/2022   CREATININE 0.71 07/11/2022   BILITOT 0.6 07/11/2022   ALKPHOS 106 08/27/2011   AST 16 07/11/2022   ALT 20 07/11/2022   PROT 6.8 07/11/2022   ALBUMIN 3.9 08/27/2011   CALCIUM 9.2 07/11/2022   GFRAA 110 06/17/2019   QFTBGOLDPLUS NEGATIVE 07/11/2022    Speciality Comments: No specialty comments available.  Procedures:  No procedures performed Allergies: Patient has no known allergies.   Assessment / Plan:     Visit Diagnoses: No diagnosis found.  Orders: No orders of the defined types were placed in this encounter.  No orders of the defined types were placed in this encounter.   Face-to-face time spent with patient was *** minutes. Greater than 50% of time was spent in counseling and coordination of care.  Follow-Up Instructions: No follow-ups on file.   Earnestine Mealing, CMA  Note - This record has been created using Editor, commissioning.  Chart creation  errors have been sought, but may not always  have been located. Such creation errors do not reflect on  the standard of medical care.

## 2022-09-13 NOTE — Progress Notes (Unsigned)
Office Visit Note  Patient: Amy Mccarthy             Date of Birth: 11-Apr-1966           MRN: [id number]             PCP: Redmond School, MD Referring: Redmond School, MD Visit Date: 09/19/2022 Occupation: _0 @  Subjective:  Pain in multiple joints   History of Present Illness: Amy Mccarthy is a 56 y.o. female with history of seropositive rheumatoid arthritis, fibromyalgia, and DDD.  Patient is not currently taking any immunosuppressive agents.  Patient presents today to discuss recent x-ray results as well as lab results.  Patient reports that she continues to have persistent pain, stiffness, and intermittent swelling in both hands, right knee, and both feet.  She had a right knee joint cortisone injection at her last office visit on 07/11/2022 which provided temporary relief.  Her symptoms returned about 2 weeks ago.  She walks on concrete during work which seems to exacerbate her symptoms.  She states that when she is on her feet for long peers of time she notices increased fluid retention in both legs.   Activities of Daily Living:  Patient reports morning stiffness for several hours.   Patient Denies nocturnal pain.  Difficulty dressing/grooming: Denies Difficulty climbing stairs: Denies Difficulty getting out of chair: Denies Difficulty using hands for taps, buttons, cutlery, and/or writing: Denies  Review of Systems  Constitutional:  Positive for fatigue.  HENT:  Positive for mouth sores and mouth dryness.   Eyes:  Positive for dryness.  Respiratory:  Positive for shortness of breath.   Cardiovascular:  Positive for palpitations. Negative for chest pain.  Gastrointestinal:  Negative for blood in stool, constipation and diarrhea.  Endocrine: Negative for increased urination.  Genitourinary:  Negative for involuntary urination.  Musculoskeletal:  Positive for joint pain, joint pain, joint swelling, myalgias, muscle weakness, morning stiffness, muscle tenderness and  myalgias. Negative for gait problem.  Skin:  Positive for rash and hair loss. Negative for color change and sensitivity to sunlight.  Allergic/Immunologic: Negative for susceptible to infections.  Neurological:  Positive for dizziness and headaches.  Hematological:  Negative for swollen glands.  Psychiatric/Behavioral:  Positive for depressed mood and sleep disturbance. The patient is nervous/anxious.     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   Objective: Vital Signs: BP 119/76 (BP Location: Left Arm, Patient Position: Sitting, Cuff Size: Large)   Pulse 61   Resp 18   Ht _1  (1.575 m)  Wt 219 lb 6.4 oz (99.5 kg)   LMP 09/06/2011   BMI 40.13 kg/m    Physical Exam Vitals and nursing note reviewed.  Constitutional:      Appearance: She is well-developed.  HENT:     Head: Normocephalic and atraumatic.  Eyes:     Conjunctiva/sclera: Conjunctivae normal.  Cardiovascular:     Rate and Rhythm: Normal rate and regular rhythm.     Heart sounds: Normal heart sounds.  Pulmonary:     Effort: Pulmonary effort is normal.     Breath sounds: Normal breath sounds.  Abdominal:     General:  Bowel sounds are normal.     Palpations: Abdomen is soft.  Musculoskeletal:     Cervical back: Normal range of motion.  Skin:    General: Skin is warm and dry.     Capillary Refill: Capillary refill takes less than 2 seconds.  Neurological:     Mental Status: She is alert and oriented to person, place, and time.  Psychiatric:        Behavior: Behavior normal.      Musculoskeletal Exam: C-spine, thoracic spine, lumbar spine have good range of motion. Painful ROM of right knee.   CDAI Exam: CDAI Score: -- Patient Global: --; Provider Global: -- Swollen: --; Tender: -- Joint Exam 09/19/2022   No joint exam has been documented for this visit   There is currently no information documented on the homunculus. Go to the Rheumatology activity and complete the homunculus joint exam.  Investigation: No additional findings.  Imaging: No results found.  Recent Labs: Lab Results  Component Value Date   WBC 6.7 06/11/2022   HGB 13.1 06/11/2022   PLT 268 06/11/2022   NA 142 07/11/2022   K 4.2 07/11/2022   CL 105 07/11/2022   CO2 28 07/11/2022   GLUCOSE 80 07/11/2022   BUN 12 07/11/2022   CREATININE 0.71 07/11/2022   BILITOT 0.6 07/11/2022   ALKPHOS 106 08/27/2011   AST 16 07/11/2022   ALT 20 07/11/2022   PROT 6.8 07/11/2022   ALBUMIN 3.9 08/27/2011   CALCIUM 9.2 07/11/2022   GFRAA 110 06/17/2019   QFTBGOLDPLUS NEGATIVE 07/11/2022    Speciality Comments: No specialty comments available.  Procedures:  No procedures performed Allergies: Patient has no known allergies.    Assessment / Plan:     Visit Diagnoses: Rheumatoid arthritis involving multiple sites with positive rheumatoid factor (HCC) - Positive rheumatoid factor, positive CCP, positive ANA, +14 33 eta: Patient presents today with ongoing joint pain and stiffness as well as intermittent joint swelling involving multiple joints including both hands, right knee, and both feet.  Lab results and x-ray results from  07/11/2022 were reviewed with the patient today in the office.  She has had no radiographic progression since 2020 but continues to have positive rheumatoid factor and positive anti-CCP.  At her last office visit on 07/11/2022 she had no active synovitis.  On examination today no obvious synovitis was noted.  Discussed that I would recommend scheduling an ultrasound of both hands to assess for synovitis prior to recommending any treatment options.  She was in agreement.  We will also apply for Visco gel injections for the right knee to help minimize her discomfort and progression of the disease since she had such a temporary response to cortisone.   High risk medication use -She is not currently taking any immunosuppressive agents.  CBC and CMP-WNL on 07/11/2022.  TB Gold negative on 07/11/2022. Rheumatoid factor  and anti-CCP remain positive-07/11/22. ESR WNL.  Previous therapy: Humira-worsened psoriasis, inadequate response to otezla, MTX-discontinued on 2020.   Psoriasis -Patient has a history of psoriasis as well as eczema.  She has not undergone a biopsy in the past.  She requested a new referral to dermatology for further evaluation.  She currently has a rash on her right lower extremity with an unclear diagnosis.  According to the patient there was questionable history of cellulitis but the rash has persisted despite completing a course of antibiotics.  Plan: Ambulatory referral to Dermatology  Primary osteoarthritis of both hands - X-rays of bilateral hands showed juxta-articular osteopenia and PIP and DIP narrowing.  RF and anti-CCP are positive.  X-rays did not reveal any radiographic progression since 2020.  On examination she has PIP and DIP thickening consistent with osteoarthritic changes.  No active synovitis was noted.  An ultrasound of both hands will be ordered to assess for synovitis.  Primary osteoarthritis of right knee - X-rays showed mild osteoarthritis and mild chondromalacia patella.  X-ray  findings were discussed with the patient today in the office.  She had a right knee joint cortisone injection performed at her last office visit on 07/11/2022 which provided temporary relief but her symptoms have returned.  She is not currently experiencing any mechanical symptoms.  She is primarily experiencing discomfort and stiffness but has noticed intermittent swelling.  On examination she has good range of motion of the right knee joint.  No warmth or effusion was noted.  Discussed different treatment option including applying for Visco gel injections for the right knee.  She was in agreement.  This patient is diagnosed with osteoarthritis of the knee(s).    Radiographs show evidence of joint space narrowing, osteophytes, subchondral sclerosis and/or subchondral cysts.  This patient has knee pain which interferes with functional and activities of daily living.    This patient has experienced inadequate response, adverse effects and/or intolerance with conservative treatments such as acetaminophen, NSAIDS, topical creams, physical therapy or regular exercise, knee bracing and/or weight loss.   This patient has experienced inadequate response or has a contraindication to intra articular steroid injections for at least 3 months.   This patient is not scheduled to have a total knee replacement within 6 months of starting treatment with viscosupplementation.  Primary osteoarthritis of both feet - X-rays were consistent with osteoarthritis.  No radiographic progression was noted when compared to the previous x-rays of 2020.  X-ray results were discussed with the patient today in the office.  She continues to experience pain and intermittent swelling in her feet.  She notices increased fluid retention after standing or walking for prolonged periods of time while at work.  DDD (degenerative disc disease), lumbar - She was evaluated at [facility].    Fibromyalgia: She has generalized hyperalgesia and  positive tender points on examination.  She continues to experience intermittent myalgias and muscle tenderness due to fibromyalgia. Discussed the possible use of Cymbalta in the future.  Other medical conditions are listed as follows:  Cellulitis of right lower limb  Impetigo  History of hypothyroidism  History of shingles  Former smoker - Quit smoking 2022.  Smoked 1 pack/day for 40 years.  Rash and other nonspecific skin eruption -right lower extremity.  Referral to dermatology placed today.  Plan: Ambulatory referral to Dermatology  Orders: Orders Placed This Encounter  Procedures   Ambulatory referral to Dermatology   No orders of the defined types were placed in this encounter.  Follow-Up Instructions: Return for Rheumatoid arthritis, Fibromyalgia, DDD.   Ofilia Neas, PA-C  Note - This record has been created using Dragon software.  Chart creation errors have been sought, but may not always  have been located. Such creation errors do not reflect on  the standard of medical care.

## 2022-09-16 ENCOUNTER — Ambulatory Visit: Payer: 59 | Admitting: Rheumatology

## 2022-09-16 DIAGNOSIS — L0100 Impetigo, unspecified: Secondary | ICD-10-CM

## 2022-09-16 DIAGNOSIS — M797 Fibromyalgia: Secondary | ICD-10-CM

## 2022-09-16 DIAGNOSIS — M79641 Pain in right hand: Secondary | ICD-10-CM

## 2022-09-16 DIAGNOSIS — M79672 Pain in left foot: Secondary | ICD-10-CM

## 2022-09-16 DIAGNOSIS — Z8639 Personal history of other endocrine, nutritional and metabolic disease: Secondary | ICD-10-CM

## 2022-09-16 DIAGNOSIS — L03115 Cellulitis of right lower limb: Secondary | ICD-10-CM

## 2022-09-16 DIAGNOSIS — G8929 Other chronic pain: Secondary | ICD-10-CM

## 2022-09-16 DIAGNOSIS — Z8619 Personal history of other infectious and parasitic diseases: Secondary | ICD-10-CM

## 2022-09-16 DIAGNOSIS — Z79899 Other long term (current) drug therapy: Secondary | ICD-10-CM

## 2022-09-16 DIAGNOSIS — M5136 Other intervertebral disc degeneration, lumbar region: Secondary | ICD-10-CM

## 2022-09-16 DIAGNOSIS — L408 Other psoriasis: Secondary | ICD-10-CM

## 2022-09-16 DIAGNOSIS — Z87891 Personal history of nicotine dependence: Secondary | ICD-10-CM

## 2022-09-16 DIAGNOSIS — M0579 Rheumatoid arthritis with rheumatoid factor of multiple sites without organ or systems involvement: Secondary | ICD-10-CM

## 2022-09-19 ENCOUNTER — Encounter: Payer: Self-pay | Admitting: Physician Assistant

## 2022-09-19 ENCOUNTER — Ambulatory Visit: Payer: 59 | Attending: Rheumatology | Admitting: Physician Assistant

## 2022-09-19 ENCOUNTER — Telehealth: Payer: Self-pay

## 2022-09-19 VITALS — BP 119/76 | HR 61 | Resp 18 | Ht 62.0 in | Wt 219.4 lb

## 2022-09-19 DIAGNOSIS — M19042 Primary osteoarthritis, left hand: Secondary | ICD-10-CM

## 2022-09-19 DIAGNOSIS — Z87891 Personal history of nicotine dependence: Secondary | ICD-10-CM

## 2022-09-19 DIAGNOSIS — M19071 Primary osteoarthritis, right ankle and foot: Secondary | ICD-10-CM | POA: Diagnosis not present

## 2022-09-19 DIAGNOSIS — M79671 Pain in right foot: Secondary | ICD-10-CM

## 2022-09-19 DIAGNOSIS — L408 Other psoriasis: Secondary | ICD-10-CM

## 2022-09-19 DIAGNOSIS — M1711 Unilateral primary osteoarthritis, right knee: Secondary | ICD-10-CM | POA: Diagnosis not present

## 2022-09-19 DIAGNOSIS — M19072 Primary osteoarthritis, left ankle and foot: Secondary | ICD-10-CM

## 2022-09-19 DIAGNOSIS — G8929 Other chronic pain: Secondary | ICD-10-CM

## 2022-09-19 DIAGNOSIS — M797 Fibromyalgia: Secondary | ICD-10-CM | POA: Diagnosis not present

## 2022-09-19 DIAGNOSIS — M5136 Other intervertebral disc degeneration, lumbar region: Secondary | ICD-10-CM | POA: Diagnosis not present

## 2022-09-19 DIAGNOSIS — M79641 Pain in right hand: Secondary | ICD-10-CM

## 2022-09-19 DIAGNOSIS — L0100 Impetigo, unspecified: Secondary | ICD-10-CM | POA: Diagnosis not present

## 2022-09-19 DIAGNOSIS — Z79899 Other long term (current) drug therapy: Secondary | ICD-10-CM | POA: Diagnosis not present

## 2022-09-19 DIAGNOSIS — M0579 Rheumatoid arthritis with rheumatoid factor of multiple sites without organ or systems involvement: Secondary | ICD-10-CM

## 2022-09-19 DIAGNOSIS — M51369 Other intervertebral disc degeneration, lumbar region without mention of lumbar back pain or lower extremity pain: Secondary | ICD-10-CM

## 2022-09-19 DIAGNOSIS — L409 Psoriasis, unspecified: Secondary | ICD-10-CM | POA: Diagnosis not present

## 2022-09-19 DIAGNOSIS — Z8619 Personal history of other infectious and parasitic diseases: Secondary | ICD-10-CM | POA: Diagnosis not present

## 2022-09-19 DIAGNOSIS — Z8639 Personal history of other endocrine, nutritional and metabolic disease: Secondary | ICD-10-CM

## 2022-09-19 DIAGNOSIS — M19041 Primary osteoarthritis, right hand: Secondary | ICD-10-CM | POA: Diagnosis not present

## 2022-09-19 DIAGNOSIS — L03115 Cellulitis of right lower limb: Secondary | ICD-10-CM

## 2022-09-19 DIAGNOSIS — R21 Rash and other nonspecific skin eruption: Secondary | ICD-10-CM

## 2022-09-19 NOTE — Telephone Encounter (Signed)
Please apply for right knee visco, per Taylor Dale, PA-C. Thanks!  

## 2022-09-19 NOTE — Telephone Encounter (Signed)
VOB pending for Euflexxa, right knee BV pending

## 2022-09-22 NOTE — Telephone Encounter (Signed)
Please call to schedule visco injections.  Approved for Euflexxa, right knee(s). Buy & Bill Covered at 75% of the allowable amount. No co-pay Deductibles do not apply Auth# [id number] Valid from 09/27/22 to 09/27/23

## 2022-09-29 NOTE — Telephone Encounter (Signed)
Reached out to patient to schedule visco injections. Patient states she has an appointment on Monday with Dr. Estanislado Pandy and would like to discuss them more with her then.

## 2022-10-03 ENCOUNTER — Ambulatory Visit: Payer: 59 | Attending: Rheumatology | Admitting: Rheumatology

## 2022-10-03 ENCOUNTER — Ambulatory Visit (INDEPENDENT_AMBULATORY_CARE_PROVIDER_SITE_OTHER): Payer: 59

## 2022-10-03 DIAGNOSIS — M19041 Primary osteoarthritis, right hand: Secondary | ICD-10-CM | POA: Diagnosis not present

## 2022-10-03 DIAGNOSIS — R202 Paresthesia of skin: Secondary | ICD-10-CM

## 2022-10-03 DIAGNOSIS — M19042 Primary osteoarthritis, left hand: Secondary | ICD-10-CM

## 2022-10-03 DIAGNOSIS — L409 Psoriasis, unspecified: Secondary | ICD-10-CM

## 2022-10-03 NOTE — Addendum Note (Signed)
Addended by: Francis Gaines C on: 10/03/2022 01:11 PM   Modules accepted: Orders

## 2022-10-03 NOTE — Progress Notes (Signed)
Visit diagnosis-pain in both hands.  Ultrasound examination was performed today to look for synovitis or tenosynovitis.  Ultrasound examination of bilateral hands was performed per EULAR recommendations. Using 15 MHz transducer, grayscale and power Doppler bilateral second, third, and fifth MCP joints and bilateral wrist joints both dorsal and volar aspects were evaluated to look for synovitis or tenosynovitis. The findings were there was no synovitis or tenosynovitis on ultrasound examination. Right median nerve was 0.23 cm squares which was more than upper limits of normal and left median nerve was 0.14 cm squares which was more than upper limits of normal.  Impression: Ultrasound examination did not show synovitis.  Bilateral median nerves were enlarged.  Ultrasound findings were discussed with the patient.  We will schedule nerve conduction velocities to evaluate for carpal tunnel syndrome.  She continues to have pain and discomfort in multiple joints.  She she had to have warmth and swelling in her right knee joint.  She also has extensive psoriasis.  She has been noticing more nail dystrophy.  I will refer her to dermatology and also schedule an appointment to start her on immunosuppressive therapy.  Bo Merino, MD

## 2022-10-05 NOTE — Progress Notes (Deleted)
Office Visit Note  Patient: Amy Mccarthy             Date of Birth: January 05, 1966           MRN: [id number]             PCP: Elfredia Nevins, MD Referring: Elfredia Nevins, MD Visit Date: 10/06/2022 Occupation: @GUAROCC @  Subjective:  No chief complaint on file.   History of Present Illness: Amy Mccarthy is a 56 y.o. female ***   Activities of Daily Living:  Patient reports morning stiffness for *** {minute/hour:19697}.   Patient {ACTIONS;DENIES/REPORTS:21021675::"Denies"} nocturnal pain.  Difficulty dressing/grooming: {ACTIONS;DENIES/REPORTS:21021675::"Denies"} Difficulty climbing stairs: {ACTIONS;DENIES/REPORTS:21021675::"Denies"} Difficulty getting out of chair: {ACTIONS;DENIES/REPORTS:21021675::"Denies"} Difficulty using hands for taps, buttons, cutlery, and/or writing: {ACTIONS;DENIES/REPORTS:21021675::"Denies"}  No Rheumatology ROS completed.   PMFS History:  Patient Active Problem List   Diagnosis Date Noted  . Rheumatoid arthritis 11/15/2016  . High risk medication use 11/15/2016  . Fibromyalgia 11/15/2016  . Psoriasis 11/15/2016  . 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 Smoker 11/15/2016  . Hypothyroidism 11/15/2016    Past Medical History:  Diagnosis Date  . Arthritis   . Rheumatoid arthritis(714.0) 8/13  . Right shoulder pain   . Shoulder pain, left   . Thyroid disease     Family History  Problem Relation Age of Onset  . Hypothyroidism Mother   . Hypothyroidism Maternal Grandmother   . Diabetes Father   . COPD Father   . Emphysema Brother   . COPD Brother    Past Surgical History:  Procedure Laterality Date  . CERVICAL DISC SURGERY     Social History   Social History Narrative  . Not on file    There is no immunization history on file for this patient.   Objective: Vital Signs: LMP 09/06/2011    Physical Exam   Musculoskeletal Exam: ***  CDAI Exam: CDAI Score: -- Patient Global: --; Provider Global: -- Swollen:  --; Tender: -- Joint Exam 10/06/2022   No joint exam has been documented for this visit   There is currently no information documented on the homunculus. Go to the Rheumatology activity and complete the homunculus joint exam.  Investigation: No additional findings.  Imaging: 13/01/2022 COMPLETE JOINT SPACE STRUCTURES UP BILAT  Result Date: 10/03/2022 Ultrasound examination of bilateral hands was performed per EULAR recommendations. Using 15 MHz transducer, grayscale and power Doppler bilateral second, third, and fifth MCP joints and bilateral wrist joints both dorsal and volar aspects were evaluated to look for synovitis or tenosynovitis. The findings were there was no synovitis or tenosynovitis on ultrasound examination. Right median nerve was 0.23 cm squares which was more than upper limits of normal and left median nerve was 0.14 cm squares which was more than upper limits of normal. Impression: Ultrasound examination did not show synovitis.  Bilateral median nerves were enlarged.   Recent Labs: Lab Results  Component Value Date   WBC 6.7 06/11/2022   HGB 13.1 06/11/2022   PLT 268 06/11/2022   NA 142 07/11/2022   K 4.2 07/11/2022   CL 105 07/11/2022   CO2 28 07/11/2022   GLUCOSE 80 07/11/2022   BUN 12 07/11/2022   CREATININE 0.71 07/11/2022   BILITOT 0.6 07/11/2022   ALKPHOS 106 08/27/2011   AST 16 07/11/2022   ALT 20 07/11/2022   PROT 6.8 07/11/2022   ALBUMIN 3.9 08/27/2011   CALCIUM 9.2 07/11/2022   GFRAA 110 06/17/2019   QFTBGOLDPLUS NEGATIVE 07/11/2022  Speciality Comments: No specialty comments available.  Procedures:  No procedures performed Allergies: Patient has no known allergies.   Assessment / Plan:     Visit Diagnoses: No diagnosis found.  Orders: No orders of the defined types were placed in this encounter.  No orders of the defined types were placed in this encounter.   Face-to-face time spent with patient was *** minutes. Greater than 50% of time was  spent in counseling and coordination of care.  Follow-Up Instructions: No follow-ups on file.   Earnestine Mealing, CMA  Note - This record has been created using Editor, commissioning.  Chart creation errors have been sought, but may not always  have been located. Such creation errors do not reflect on  the standard of medical care.

## 2022-10-06 ENCOUNTER — Ambulatory Visit: Payer: 59 | Admitting: Physician Assistant

## 2022-10-19 ENCOUNTER — Ambulatory Visit (INDEPENDENT_AMBULATORY_CARE_PROVIDER_SITE_OTHER): Payer: 59 | Admitting: Physical Medicine and Rehabilitation

## 2022-10-19 DIAGNOSIS — R202 Paresthesia of skin: Secondary | ICD-10-CM | POA: Diagnosis not present

## 2022-10-21 NOTE — Procedures (Signed)
EMG & NCV Findings: Evaluation of the right median motor nerve showed prolonged distal onset latency (7.4 ms), reduced amplitude (1.5 mV), and decreased conduction velocity (Elbow-Wrist, 45 m/s).  The left median (across palm) sensory nerve showed prolonged distal peak latency (Wrist, 4.1 ms).  The right median (across palm) sensory nerve showed prolonged distal peak latency (Wrist, 5.8 ms), reduced amplitude (3.5 V), and prolonged distal peak latency (Palm, 5.4 ms).  All remaining nerves (as indicated in the following tables) were within normal limits.  Left vs. Right side comparison data for the median motor nerve indicates abnormal L-R latency difference (3.5 ms) and abnormal L-R amplitude difference (77.9 %).    All examined muscles (as indicated in the following table) showed no evidence of electrical instability.    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   a mild left median nerve entrapment at the wrist (carpal tunnel syndrome) affecting sensory components.   There is no significant electrodiagnostic evidence of any other focal nerve entrapment, brachial plexopathy or cervical radiculopathy. **This electrodiagnostic study cannot rule out small fiber polyneuropathy and dysesthesias from central pain syndromes such as stroke or central pain sensitization syndromes such as fibromyalgia.  Myotomal referral pain from trigger points is also not excluded.  Recommendations: 1.  Follow-up with referring physician. 2.  Continue current management of symptoms. 3.  Continue use of resting splint at night-time and as needed during the day. 4.  Suggest surgical evaluation one of our [facility] surgeons.  ___________________________ Naaman Plummer FAAPMR Board Certified, American Board of Physical Medicine and Rehabilitation    Nerve  Conduction Studies Anti Sensory Summary Table   Stim Site NR Peak (ms) Norm Peak (ms) P-T Amp (V) Norm P-T Amp Site1 Site2 Delta-P (ms) Dist (cm) Vel (m/s) Norm Vel (m/s)  Left Median Acr Palm Anti Sensory (2nd Digit)  30C  Wrist    *4.1 <3.6 36.6 >10 Wrist Palm 2.1 0.0    Palm    2.0 <2.0 68.2         Right Median Acr Palm Anti Sensory (2nd Digit)  29.1C  Wrist    *5.8 <3.6 *3.5 >10 Wrist Palm 0.4 0.0    Palm    *5.4 <2.0 15.6         Right Radial Anti Sensory (Base 1st Digit)  29.2C  Wrist    2.1 <3.1 57.8  Wrist Base 1st Digit 2.1 0.0    Right Ulnar Anti Sensory (5th Digit)  29.4C  Wrist    3.4 <3.7 50.4 >15.0 Wrist 5th Digit 3.4 14.0 41 >38   Motor Summary Table   Stim Site NR Onset (ms) Norm Onset (ms) O-P Amp (mV) Norm O-P Amp Site1 Site2 Delta-0 (ms) Dist (cm) Vel (m/s) Norm Vel (m/s)  Left Median Motor (Abd Poll Brev)  30.1C  Wrist    3.9 <4.2 6.8 >5 Elbow Wrist 3.4 17.5 51 >50  Elbow    7.3  6.7         Right Median Motor (Abd Poll Brev)  29.2C  Wrist    *7.4 <4.2 *1.5 >5 Elbow Wrist 4.2 19.0 *45 >50  Elbow    11.6  3.8         Right Ulnar Motor (Abd Dig Min)  29.4C  Wrist    3.0 <4.2 10.8 >3 B Elbow Wrist 3.1 19.0 61 >53  B Elbow  6.1  11.0  A Elbow B Elbow 1.1 10.0 91 >53  A Elbow    7.2  10.5          EMG   Side Muscle Nerve Root Ins Act Fibs Psw Amp Dur Poly Recrt Int Dennie Bible Comment  Right Abd Poll Brev Median C8-T1 Nml Nml Nml Nml Nml 0 Nml Nml   Right 1stDorInt Ulnar C8-T1 Nml Nml Nml Nml Nml 0 Nml Nml   Right PronatorTeres Median C6-7 Nml Nml Nml Nml Nml 0 Nml Nml   Right Biceps Musculocut C5-6 Nml Nml Nml Nml Nml 0 Nml Nml   Right Deltoid Axillary C5-6 Nml Nml Nml Nml Nml 0 Nml Nml     Nerve Conduction Studies Anti Sensory Left/Right Comparison   Stim Site L Lat (ms) R Lat (ms) L-R Lat (ms) L Amp (V) R Amp (V) L-R Amp (%) Site1 Site2 L Vel (m/s) R Vel (m/s) L-R Vel (m/s)  Median Acr Palm Anti Sensory (2nd Digit)  30C  Wrist *4.1 *5.8 1.7 36.6  *3.5 90.4 Wrist Palm     Palm 2.0 *5.4 3.4 68.2 15.6 77.1       Radial Anti Sensory (Base 1st Digit)  29.2C  Wrist  2.1   57.8  Wrist Base 1st Digit     Ulnar Anti Sensory (5th Digit)  29.4C  Wrist  3.4   50.4  Wrist 5th Digit  41    Motor Left/Right Comparison   Stim Site L Lat (ms) R Lat (ms) L-R Lat (ms) L Amp (mV) R Amp (mV) L-R Amp (%) Site1 Site2 L Vel (m/s) R Vel (m/s) L-R Vel (m/s)  Median Motor (Abd Poll Brev)  30.1C  Wrist 3.9 *7.4 *3.5 6.8 *1.5 *77.9 Elbow Wrist 51 *45 6  Elbow 7.3 11.6 4.3 6.7 3.8 43.3       Ulnar Motor (Abd Dig Min)  29.4C  Wrist  3.0   10.8  B Elbow Wrist  61   B Elbow  6.1   11.0  A Elbow B Elbow  91   A Elbow  7.2   10.5           Waveforms:

## 2022-10-24 DIAGNOSIS — E559 Vitamin D deficiency, unspecified: Secondary | ICD-10-CM | POA: Diagnosis not present

## 2022-10-24 DIAGNOSIS — M069 Rheumatoid arthritis, unspecified: Secondary | ICD-10-CM | POA: Diagnosis not present

## 2022-10-24 DIAGNOSIS — A6922 Other neurologic disorders in Lyme disease: Secondary | ICD-10-CM | POA: Diagnosis not present

## 2022-10-24 NOTE — Progress Notes (Signed)
Amy Mccarthy - 56 y.o. female MRN [id number]  Date of birth: 18-May-1966  Office Visit Note: Visit Date: 10/19/2022 PCP: Elfredia Nevins, MD Referred by: Pollyann Savoy, MD  Subjective: Chief Complaint  Patient presents with   Left Hand - Pain, Numbness, Weakness   Right Hand - Pain, Numbness, Weakness    Symptoms mainly on right side   HPI:  Amy Mccarthy is a 56 y.o. female who comes in today at the request of Dr. Pollyann Savoy for electrodiagnostic study of the Bilateral upper extremities.  Patient is Right hand dominant.  She reports chronic worsening severe pain numbness and tingling sometimes whole hand globally but with further inquiry mostly radial digits right more than left.  No prior electrodiagnostic studies.  Course complicated by hypothyroidism and rheumatoid arthritis and psoriasis.  Her case is further complicated by history of fibromyalgia and multiple joint pain.  No frank radicular symptoms.   ROS Otherwise per HPI.  Assessment & Plan: Visit Diagnoses:    ICD-10-CM   1. 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   a mild left median nerve entrapment at the wrist (carpal tunnel syndrome) affecting sensory components.   There is no significant electrodiagnostic evidence of any other focal nerve entrapment, brachial plexopathy or cervical radiculopathy. **This electrodiagnostic study cannot rule out small fiber polyneuropathy and dysesthesias from central pain syndromes such as stroke or central pain sensitization syndromes such as fibromyalgia.  Myotomal referral pain from trigger points is also not excluded.  Recommendations: 1.  Follow-up with referring physician. 2.  Continue current management of symptoms. 3.  Continue use of resting  splint at night-time and as needed during the day. 4.  Suggest surgical evaluation one of our [facility] surgeons.  Meds & Orders: No orders of the defined types were placed in this encounter.   Orders Placed This Encounter  Procedures   NCV with EMG (electromyography)    Follow-up: Return in about 2 weeks (around 11/02/2022) for Melvenia Needles, MD.   Procedures: No procedures performed  EMG & NCV Findings: Evaluation of the right median motor nerve showed prolonged distal onset latency (7.4 ms), reduced amplitude (1.5 mV), and decreased conduction velocity (Elbow-Wrist, 45 m/s).  The left median (across palm) sensory nerve showed prolonged distal peak latency (Wrist, 4.1 ms).  The right median (across palm) sensory nerve showed prolonged distal peak latency (Wrist, 5.8 ms), reduced amplitude (3.5 V), and prolonged distal peak latency (Palm, 5.4 ms).  All remaining nerves (as indicated in the following tables) were within normal limits.  Left vs. Right side comparison data for the median motor nerve indicates abnormal L-R latency difference (3.5 ms) and abnormal L-R amplitude difference (77.9 %).    All examined muscles (as indicated in the following table) showed no evidence of electrical instability.    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   a mild left median nerve entrapment at the wrist (carpal tunnel syndrome) affecting sensory components.   There is no significant electrodiagnostic evidence of any other focal nerve entrapment, brachial plexopathy or cervical radiculopathy. **This electrodiagnostic study cannot rule out small fiber polyneuropathy and dysesthesias from central pain syndromes  such as stroke or central pain sensitization syndromes such as fibromyalgia.  Myotomal referral pain from trigger points is also not  excluded.  Recommendations: 1.  Follow-up with referring physician. 2.  Continue current management of symptoms. 3.  Continue use of resting splint at night-time and as needed during the day. 4.  Suggest surgical evaluation one of our [facility] surgeons.  ___________________________ Naaman Plummer FAAPMR Board Certified, American Board of Physical Medicine and Rehabilitation    Nerve Conduction Studies Anti Sensory Summary Table   Stim Site NR Peak (ms) Norm Peak (ms) P-T Amp (V) Norm P-T Amp Site1 Site2 Delta-P (ms) Dist (cm) Vel (m/s) Norm Vel (m/s)  Left Median Acr Palm Anti Sensory (2nd Digit)  30C  Wrist    *4.1 <3.6 36.6 >10 Wrist Palm 2.1 0.0    Palm    2.0 <2.0 68.2         Right Median Acr Palm Anti Sensory (2nd Digit)  29.1C  Wrist    *5.8 <3.6 *3.5 >10 Wrist Palm 0.4 0.0    Palm    *5.4 <2.0 15.6         Right Radial Anti Sensory (Base 1st Digit)  29.2C  Wrist    2.1 <3.1 57.8  Wrist Base 1st Digit 2.1 0.0    Right Ulnar Anti Sensory (5th Digit)  29.4C  Wrist    3.4 <3.7 50.4 >15.0 Wrist 5th Digit 3.4 14.0 41 >38   Motor Summary Table   Stim Site NR Onset (ms) Norm Onset (ms) O-P Amp (mV) Norm O-P Amp Site1 Site2 Delta-0 (ms) Dist (cm) Vel (m/s) Norm Vel (m/s)  Left Median Motor (Abd Poll Brev)  30.1C  Wrist    3.9 <4.2 6.8 >5 Elbow Wrist 3.4 17.5 51 >50  Elbow    7.3  6.7         Right Median Motor (Abd Poll Brev)  29.2C  Wrist    *7.4 <4.2 *1.5 >5 Elbow Wrist 4.2 19.0 *45 >50  Elbow    11.6  3.8         Right Ulnar Motor (Abd Dig Min)  29.4C  Wrist    3.0 <4.2 10.8 >3 B Elbow Wrist 3.1 19.0 61 >53  B Elbow    6.1  11.0  A Elbow B Elbow 1.1 10.0 91 >53  A Elbow    7.2  10.5          EMG   Side Muscle Nerve Root Ins Act Fibs Psw Amp Dur Poly Recrt Int Dennie Bible Comment  Right Abd Poll Brev Median C8-T1 Nml Nml Nml Nml Nml 0 Nml Nml   Right 1stDorInt Ulnar C8-T1 Nml Nml Nml Nml Nml 0 Nml Nml   Right PronatorTeres Median C6-7 Nml Nml Nml Nml Nml 0 Nml Nml    Right Biceps Musculocut C5-6 Nml Nml Nml Nml Nml 0 Nml Nml   Right Deltoid Axillary C5-6 Nml Nml Nml Nml Nml 0 Nml Nml     Nerve Conduction Studies Anti Sensory Left/Right Comparison   Stim Site L Lat (ms) R Lat (ms) L-R Lat (ms) L Amp (V) R Amp (V) L-R Amp (%) Site1 Site2 L Vel (m/s) R Vel (m/s) L-R Vel (m/s)  Median Acr Palm Anti Sensory (2nd Digit)  30C  Wrist *4.1 *5.8 1.7 36.6 *3.5 90.4 Wrist Palm     Palm 2.0 *5.4 3.4 68.2 15.6 77.1       Radial Anti Sensory (Base 1st Digit)  29.2C  Wrist  2.1   57.8  Wrist Base 1st Digit     Ulnar Anti Sensory (5th Digit)  29.4C  Wrist  3.4   50.4  Wrist 5th Digit  41    Motor Left/Right Comparison   Stim Site L Lat (ms) R Lat (ms) L-R Lat (ms) L Amp (mV) R Amp (mV) L-R Amp (%) Site1 Site2 L Vel (m/s) R Vel (m/s) L-R Vel (m/s)  Median Motor (Abd Poll Brev)  30.1C  Wrist 3.9 *7.4 *3.5 6.8 *1.5 *77.9 Elbow Wrist 51 *45 6  Elbow 7.3 11.6 4.3 6.7 3.8 43.3       Ulnar Motor (Abd Dig Min)  29.4C  Wrist  3.0   10.8  B Elbow Wrist  61   B Elbow  6.1   11.0  A Elbow B Elbow  91   A Elbow  7.2   10.5           Waveforms:                 Clinical History: 09/26/2022  Visit diagnosis-pain in both hands.  Ultrasound examination was performed today to look for synovitis or tenosynovitis.   Ultrasound examination of bilateral hands was performed per EULAR recommendations. Using 15 MHz transducer, grayscale and power Doppler bilateral second, third, and fifth MCP joints and bilateral wrist joints both dorsal and volar aspects were evaluated to look for synovitis or tenosynovitis. The findings were there was no synovitis or tenosynovitis on ultrasound examination. Right median nerve was 0.23 cm squares which was more than upper limits of normal and left median nerve was 0.14 cm squares which was more than upper limits of normal.   Impression: Ultrasound examination did not show synovitis.  Bilateral median nerves were enlarged.  Pollyann Savoy, MD     Objective:  VS:  HT:    WT:   BMI:     BP:   HR: bpm  TEMP: ( )  RESP:  Physical Exam Musculoskeletal:        General: No swelling, tenderness or deformity.     Comments: Inspection reveals no atrophy of the bilateral APB or FDI or hand intrinsics. There is no swelling, color changes, allodynia or dystrophic changes. There is 5 out of 5 strength in the bilateral wrist extension, finger abduction and long finger flexion. There is impaired sensation to light touch in the right median nerve distribution.  There is a negative Hoffmann's test bilaterally.  Skin:    General: Skin is warm and dry.     Findings: No erythema or rash.  Neurological:     General: No focal deficit present.     Mental Status: She is alert and oriented to person, place, and time.     Motor: No weakness or abnormal muscle tone.     Coordination: Coordination normal.  Psychiatric:        Mood and Affect: Mood normal.        Behavior: Behavior normal.      Imaging: No results found.

## 2022-10-25 ENCOUNTER — Telehealth: Payer: Self-pay | Admitting: *Deleted

## 2022-10-25 NOTE — Telephone Encounter (Signed)
Attempted to contact patient to schedule follow up appointment. Was unable to leave message, voicemail full.

## 2022-10-25 NOTE — Telephone Encounter (Signed)
She should be seen in the office every 5 months to monitor rheumatoid arthritis.

## 2022-10-25 NOTE — Telephone Encounter (Signed)
Patient advised nerve conduction velocities showed severe right carpal tunnel syndrome. Patient advised we need to refer her to orthopedics for surgery. Patient states she would like to do some research and think about where she would like to be referred to. Patient will reach out after Thanksgiving to let us know so we can place referral.   Patient would like to know when she should schedule her follow in our office. Please advise.

## 2022-10-25 NOTE — Telephone Encounter (Signed)
-----   Message from Pollyann Savoy, MD sent at 10/25/2022  1:11 PM EST ----- Nerve conduction velocities showed severe right carpal tunnel syndrome.  Please refer patient to orthopedics for surgery .Pollyann Savoy, MD    ----- Message ----- From: Tyrell Antonio, MD Sent: 10/24/2022   6:37 AM EST To: Pollyann Savoy, MD

## 2022-10-31 ENCOUNTER — Other Ambulatory Visit: Payer: Self-pay | Admitting: *Deleted

## 2022-10-31 ENCOUNTER — Telehealth: Payer: Self-pay | Admitting: Rheumatology

## 2022-10-31 DIAGNOSIS — R202 Paresthesia of skin: Secondary | ICD-10-CM

## 2022-10-31 NOTE — Telephone Encounter (Signed)
Patient called the office stating she was supposed let Dr. Corliss Skains know what surgeon to send a referral to for carpel tunnel. Patient requests a referral be sent to Dr. Amanda Pea at [facility].

## 2022-10-31 NOTE — Progress Notes (Unsigned)
Office Visit Note  Patient: Amy Mccarthy             Date of Birth: 09/14/1966           MRN: [id number]             PCP: Elfredia Nevins, MD Referring: Elfredia Nevins, MD Visit Date: 11/01/2022 Occupation: @  Subjective:  Right knee joint pain   History of Present Illness: Amy Mccarthy is a 56 y.o. female with history of seropositive rheumatoid arthritis, fibromyalgia, and osteoarthritis.  Patient presents today with ongoing generalized arthralgias and myalgias.  Her pain has been most severe in her right shoulder, right wrist, and right knee joint.  She is currently having difficulty ambulating due to the severity of pain in her right knee.  She requested a right knee joint cortisone injection which has alleviated her symptoms in the past.  She states that she is currently awaiting an appointment with Dr. 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  She is aware that the ultrasound of her hands performed on 10/03/2022 was negative for synovitis.  She continues to have thickened scaly lesions on her right lower extremity as well as fingernail changes concerning for psoriasis.  In the past the rash was worsened by Humira and had no response to [place].  She has not seen dermatology recently and has not had a biopsy in the past.     Activities of Daily Living:  Patient reports morning stiffness for 1-2 hours.   Patient Reports nocturnal pain.  Difficulty dressing/grooming: Reports Difficulty climbing stairs: Reports Difficulty getting out of chair: Reports Difficulty using hands for taps, buttons, cutlery, and/or writing: Reports  Review of Systems  Constitutional:  Positive for fatigue.  HENT:  Positive for mouth dryness. Negative for mouth sores.   Eyes:  Positive for dryness.  Respiratory:  Negative for shortness of breath.   Cardiovascular:  Positive for palpitations. Negative for chest pain.   Gastrointestinal:  Negative for blood in stool, constipation and diarrhea.  Endocrine: Negative for increased urination.  Genitourinary:  Negative for involuntary urination.  Musculoskeletal:  Positive for joint pain, gait problem, joint pain, joint swelling, myalgias, muscle weakness, morning stiffness, muscle tenderness and myalgias.  Skin:  Positive for rash and hair loss. Negative for color change and sensitivity to sunlight.  Allergic/Immunologic: Negative for susceptible to infections.  Neurological:  Negative for dizziness and headaches.  Hematological:  Negative for swollen glands.  Psychiatric/Behavioral:  Positive for depressed mood and sleep disturbance. The patient is nervous/anxious.     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   Objective: Vital Signs: BP 131/78 (BP Location: Left Arm, Patient Position: Sitting, Cuff Size: Large)   Pulse 70   Resp 18   Ht 5\' 2"  (1.575 m)   Wt 218 lb 9.6 oz (99.2 kg)   LMP 09/06/2011   BMI 39.98 kg/m    Physical Exam Vitals and nursing note reviewed.  Constitutional:      Appearance: She is well-developed.  HENT:     Head: Normocephalic  and atraumatic.  Eyes:     Conjunctiva/sclera: Conjunctivae normal.  Cardiovascular:     Rate and Rhythm: Normal rate and regular rhythm.     Heart sounds: Normal heart sounds.  Pulmonary:     Effort: Pulmonary effort is normal.     Breath sounds: Normal breath sounds.  Abdominal:     General: Bowel sounds are normal.     Palpations: Abdomen is soft.  Musculoskeletal:     Cervical back: Normal range of motion.  Skin:    General: Skin is warm and dry.     Capillary Refill: Capillary refill takes less than 2 seconds.     Comments: Erythematous patches with overlying scaling noted on right lower leg.  Fingernail pitting noted.   Neurological:     Mental Status: She is alert and oriented to person, place, and time.  Psychiatric:        Behavior: Behavior normal.      Musculoskeletal Exam: C-spine has limited ROM.  Right shoulder has discomfort with ROM.  Left shoulder has full ROM with no discomfort. Elbow joints, wrist joints, MCPs, PIPs, and DIPs good ROM with no synovitis.  Complete fist formation bilaterally.  Tenderness over the right trochanteric bursa. Painful ROM of the right knee joint.  Limited extension of the right knee.  No warmth or effusion of right knee.  Left knee has good ROM with no warmth or effusion.  Ankle joints have good ROM with no tenderness or joint swelling.   CDAI Exam: CDAI Score: -- Patient Global: --; Provider Global: -- Swollen: --; Tender: -- Joint Exam 11/01/2022   No joint exam has been documented for this visit   There is currently no information documented on the homunculus. Go to the Rheumatology activity and complete the homunculus joint exam.  Investigation: No additional findings.  Imaging: NCV with EMG (electromyography)  Result Date: 10/19/2022 Tyrell Antonio, MD     10/24/2022  6:37 AM EMG & NCV Findings: Evaluation of the right median motor nerve showed prolonged distal onset latency (7.4 ms), reduced amplitude (1.5 mV), and  decreased conduction velocity (Elbow-Wrist, 45 m/s).  The left median (across palm) sensory nerve showed prolonged distal peak latency (Wrist, 4.1 ms).  The right median (across palm) sensory nerve showed prolonged distal peak latency (Wrist, 5.8 ms), reduced amplitude (3.5 V), and prolonged distal peak latency (Palm, 5.4 ms).  All remaining nerves (as indicated in the following tables) were within normal limits.  Left vs. Right side comparison data for the median motor nerve indicates abnormal L-R latency difference (3.5 ms) and abnormal L-R amplitude difference (77.9 %).  All examined muscles (as indicated in the following table) showed no evidence of electrical instability.  Impression: The above electrodiagnostic study is ABNORMAL and reveals evidence of: a severe right median nerve entrapment at the wrist (carpal tunnel syndrome) affecting sensory and motor components. a mild left median nerve entrapment  at the wrist (carpal tunnel syndrome) affecting sensory components. There is no significant electrodiagnostic evidence of any other focal nerve entrapment, brachial plexopathy or cervical radiculopathy. **This electrodiagnostic study cannot rule out small fiber polyneuropathy and dysesthesias from central pain syndromes such as stroke or central pain sensitization syndromes such as fibromyalgia.  Myotomal referral pain from trigger points is also not excluded. Recommendations: 1.  Follow-up with referring physician. 2.  Continue current management of symptoms. 3.  Continue use of resting splint at night-time and as needed during the day. 4.  Suggest surgical evaluation one of our [facility] surgeons. ___________________________ Naaman Plummer FAAPMR Board Certified, American Board of Physical Medicine and Rehabilitation Nerve Conduction Studies Anti Sensory Summary Table  Stim Site NR Peak (ms) Norm Peak (ms) P-T Amp (V) Norm P-T Amp Site1 Site2 Delta-P (ms) Dist (cm) Vel (m/s) Norm Vel (m/s) Left Median Acr Palm  Anti Sensory (2nd Digit)  30C Wrist    *4.1 <3.6 36.6 >10 Wrist Palm 2.1 0.0   Palm    2.0 <2.0 68.2        Right Median Acr Palm Anti Sensory (2nd Digit)  29.1C Wrist    *5.8 <3.6 *3.5 >10 Wrist Palm 0.4 0.0   Palm    *5.4 <2.0 15.6        Right Radial Anti Sensory (Base 1st Digit)  29.2C Wrist    2.1 <3.1 57.8  Wrist Base 1st Digit 2.1 0.0   Right Ulnar Anti Sensory (5th Digit)  29.4C Wrist    3.4 <3.7 50.4 >15.0 Wrist 5th Digit 3.4 14.0 41 >38 Motor Summary Table  Stim Site NR Onset (ms) Norm Onset (ms) O-P Amp (mV) Norm O-P Amp Site1 Site2 Delta-0 (ms) Dist (cm) Vel (m/s) Norm Vel (m/s) Left Median Motor (Abd Poll Brev)  30.1C Wrist    3.9 <4.2 6.8 >5 Elbow Wrist 3.4 17.5 51 >50 Elbow    7.3  6.7        Right Median Motor (Abd Poll Brev)  29.2C Wrist    *7.4 <4.2 *1.5 >5 Elbow Wrist 4.2 19.0 *45 >50 Elbow    11.6  3.8        Right Ulnar Motor (Abd Dig Min)  29.4C Wrist    3.0 <4.2 10.8 >3 B Elbow Wrist 3.1 19.0 61 >53 B Elbow    6.1  11.0  A Elbow B Elbow 1.1 10.0 91 >53 A Elbow    7.2  10.5        EMG  Side Muscle Nerve Root Ins Act Fibs Psw Amp Dur Poly Recrt Int Dennie Bible Comment Right Abd Poll Brev Median C8-T1 Nml Nml Nml Nml Nml 0 Nml Nml  Right 1stDorInt Ulnar C8-T1 Nml Nml Nml Nml Nml 0 Nml Nml  Right PronatorTeres Median C6-7 Nml Nml Nml Nml Nml 0 Nml Nml  Right Biceps Musculocut C5-6 Nml Nml Nml Nml Nml 0 Nml Nml  Right Deltoid Axillary C5-6 Nml Nml Nml Nml Nml 0 Nml Nml  Nerve Conduction Studies Anti Sensory Left/Right Comparison  Stim Site L Lat (ms) R Lat (ms) L-R Lat (ms) L Amp (V) R Amp (V) L-R Amp (%) Site1 Site2 L Vel (m/s) R Vel (m/s) L-R Vel (m/s) Median Acr Palm Anti Sensory (2nd Digit)  30C Wrist *4.1 *5.8 1.7 36.6 *3.5 90.4 Wrist Palm    Palm 2.0 *5.4 3.4 68.2 15.6 77.1      Radial Anti Sensory (Base 1st Digit)  29.2C Wrist  2.1   57.8  Wrist Base 1st Digit    Ulnar Anti Sensory (5th Digit)  29.4C Wrist  3.4   50.4  Wrist 5th Digit  41  Motor Left/Right Comparison  Stim Site L Lat  (ms) R Lat (ms) L-R Lat (ms) L Amp (mV) R Amp (mV) L-R Amp (%) Site1 Site2 L Vel (m/s) R Vel (m/s) L-R Vel (m/s) Median Motor (Abd Poll Brev)  30.1C Wrist 3.9 *7.4 *3.5 6.8 *1.5 *77.9 Elbow Wrist 51 *45 6 Elbow 7.3 11.6 4.3 6.7 3.8 43.3      Ulnar Motor (Abd Dig Min)  29.4C Wrist  3.0   10.8  B Elbow Wrist  61  B Elbow  6.1   11.0  A Elbow B Elbow  91  A Elbow  7.2   10.5       Waveforms:          [place] COMPLETE JOINT SPACE STRUCTURES UP BILAT  Result Date: 10/03/2022 Ultrasound examination of bilateral hands was performed per EULAR recommendations. Using 15 MHz transducer, grayscale and power Doppler bilateral second, third, and fifth MCP joints and bilateral wrist joints both dorsal and volar aspects were evaluated to look for synovitis or tenosynovitis. The findings were there was no synovitis or tenosynovitis on ultrasound examination. Right median nerve was 0.23 cm squares which was more than upper limits of normal and left median nerve was 0.14 cm squares which was more than upper limits of normal. Impression: Ultrasound examination did not show synovitis.  Bilateral median nerves were enlarged.   Recent Labs: Lab Results  Component Value Date   WBC 6.7 06/11/2022   HGB 13.1 06/11/2022   PLT 268 06/11/2022   NA 142 07/11/2022   K 4.2 07/11/2022   CL 105 07/11/2022   CO2 28 07/11/2022   GLUCOSE 80 07/11/2022   BUN 12 07/11/2022   CREATININE 0.71 07/11/2022   BILITOT 0.6 07/11/2022   ALKPHOS 106 08/27/2011   AST 16 07/11/2022   ALT 20 07/11/2022   PROT 6.8 07/11/2022   ALBUMIN 3.9 08/27/2011   CALCIUM 9.2 07/11/2022   GFRAA 110 06/17/2019   QFTBGOLDPLUS NEGATIVE 07/11/2022    Speciality Comments: No specialty comments available.  Procedures:  Large Joint Inj: R knee on 11/01/2022 10:06 AM Indications: pain Details: 27 G 1.5 in needle, medial approach  Arthrogram: No  Medications: 1.5 mL lidocaine 1 %; 40 mg triamcinolone acetonide 40 MG/ML Aspirate: 0 mL Outcome:  tolerated well, no immediate complications Procedure, treatment alternatives, risks and benefits explained, specific risks discussed. Consent was given by the patient. Immediately prior to procedure a time out was called to verify the correct patient, procedure, equipment, support staff and site/side marked as required. Patient was prepped and draped in the usual sterile fashion.     Allergies: Patient has no known allergies.   Assessment / Plan:     Visit Diagnoses: Rheumatoid arthritis involving multiple sites with positive rheumatoid factor (HCC) - Positive rheumatoid factor, positive CCP, positive ANA, +14 33 eta:  Ultrasound of both hands performed on 10/03/22 did not reveal any synovitis.  She has no synovitis on examination today.  She presents today with increased discomfort in her right shoulder and right knee joint.  She has been experiencing increased discomfort when lying on her right side at night.  She has also been having difficulty ambulating due to the severity of pain and stiffness in her right knee.  No warmth or effusion of the right knee joint noted.  She requested a  right knee joint cortisone injection today which has alleviated her symptoms in the past.  She tolerated the injection without any side effects and the procedure note was completed above. Discussed that she does not have any clinical features of active rheumatoid arthritis at this time.  Discussed that we are apprehensive to add an immunosuppressive agent currently for the management of RA. Of note she has a questionable history of psoriasis but has not had a biopsy performed.  She previously had no response to the use of Otezla and had a worsening rash while taking Humira.  Discussed that I would recommend having a biopsy performed to confirm the diagnosis of psoriasis prior to [place] adding any immunosuppressive agents.  Discussed the possible use of Skyrizi for management of psoriasis once the diagnosis has been confirmed.   Discussed that Cristy Folks is not indicated for management of rheumatoid arthritis. An urgent referral to dermatology was placed today for further evaluation. She will follow up in 6 weeks or sooner if needed and we will further discuss treatment options at that time.   High risk medication use - Previous therapy: Humira-worsened psoriasis, inadequate response to otezla, MTX-discontinued on 2020. Apprehensive to add an immunosuppressive agent until we have confirmed the diagnosis of psoriasis.  Discussed the possible use of Skyrizi in the future but discussed that it will not treat rheumatoid arthritis.  As of right now she has no clinical features of active RA.  No synovitis was noted on ultrasound from 10/03/2022.  Psoriasis - Patient has a history of psoriasis as well as eczema. Evaluated by Dr. Sharyn Lull in the past.  She has not undergone a biopsy yet. On examination she has fingernail pitting as well as an erythematous rash on her right lower leg with overlying scaling.  The rash is concerning for possible psoriasis but we would like to have the diagnosis confirmed prior to adding any immunosuppressive agents.  In the past she had no response to [facility] and the rash was worsened when initiated on Humira. An urgent referral to dermatology will be placed today to confirm the diagnosis of psoriasis in order for [place] to determine appropriate treatment options going forward. No clinical features of psoriatic arthritis on examination today. - Plan: Ambulatory referral to Dermatology  Primary osteoarthritis of both hands - X-rays of bilateral hands showed juxta-articular osteopenia and PIP and DIP narrowing.  RF and anti-CCP are positive.  Ultrasound of both hands was negative: 10/03/2022.  No synovitis was noted on examination today.  She has no clinical features of active rheumatoid arthritis at this time.  Primary osteoarthritis of right knee - X-rays showed mild osteoarthritis and mild chondromalacia  patella.  Patient presents today with increased pain in the right knee joint.  According to the patient 1 year ago she pivoted wrong and had increased discomfort in her right knee.  Patient reports that after completing physical therapy for her lower back she started to have increased discomfort in her right knee.  On examination she was walking with a limp and has slightly limited extension of the right knee.  No warmth or effusion noted.  She had a cortisone injection on 07/11/2022 which provided temporary relief but her symptoms have returned.  She requested a repeat cortisone injection today.  She tolerated the procedure well.  Procedure note was completed above.  Aftercare was discussed.  If her symptoms persist or worsen I would recommend proceeding with a MRI of the right knee for further evaluation to rule out an  internal derangement.   Primary osteoarthritis of both feet - X-rays were consistent with osteoarthritis.  No radiographic progression was noted when compared to the previous x-rays of 2020.  She is not experiencing any increased discomfort in her feet at this time.   DDD (degenerative disc disease), lumbar - She was evaluated at [facility].  Minimal improvement with PT.   Fibromyalgia: She continues to experience intermittent myalgias and muscle tenderness due to fibromyalgia.   Other medical conditions are listed as follows:   Cellulitis of right lower limb: Resolved.   Impetigo  History of shingles  History of hypothyroidism  Former smoker - Quit smoking 2022.  Smoked 1 pack/day for 40 years.  Rash and other nonspecific skin eruption - right lower extremity. referred to dermatology at the last visit.  Orders: Orders Placed This Encounter  Procedures   Large Joint Inj: R knee   Ambulatory referral to Dermatology   No orders of the defined types were placed in this encounter.   Follow-Up Instructions: Return in about 6 weeks (around 12/13/2022).   Gearldine Bienenstock,  PA-C  Note - This record has been created using Dragon software.  Chart creation errors have been sought, but may not always  have been located. Such creation errors do not reflect on  the standard of medical care.

## 2022-10-31 NOTE — Telephone Encounter (Signed)
Referral placed.

## 2022-11-01 ENCOUNTER — Encounter: Payer: Self-pay | Admitting: Physician Assistant

## 2022-11-01 ENCOUNTER — Ambulatory Visit: Payer: 59 | Attending: Physician Assistant | Admitting: Physician Assistant

## 2022-11-01 VITALS — BP 131/78 | HR 70 | Resp 18 | Ht 62.0 in | Wt 218.6 lb

## 2022-11-01 DIAGNOSIS — L03115 Cellulitis of right lower limb: Secondary | ICD-10-CM | POA: Diagnosis not present

## 2022-11-01 DIAGNOSIS — L0100 Impetigo, unspecified: Secondary | ICD-10-CM

## 2022-11-01 DIAGNOSIS — R21 Rash and other nonspecific skin eruption: Secondary | ICD-10-CM

## 2022-11-01 DIAGNOSIS — M1711 Unilateral primary osteoarthritis, right knee: Secondary | ICD-10-CM

## 2022-11-01 DIAGNOSIS — Z79899 Other long term (current) drug therapy: Secondary | ICD-10-CM | POA: Diagnosis not present

## 2022-11-01 DIAGNOSIS — M5136 Other intervertebral disc degeneration, lumbar region: Secondary | ICD-10-CM

## 2022-11-01 DIAGNOSIS — M19072 Primary osteoarthritis, left ankle and foot: Secondary | ICD-10-CM

## 2022-11-01 DIAGNOSIS — M19071 Primary osteoarthritis, right ankle and foot: Secondary | ICD-10-CM

## 2022-11-01 DIAGNOSIS — M797 Fibromyalgia: Secondary | ICD-10-CM | POA: Diagnosis not present

## 2022-11-01 DIAGNOSIS — Z8619 Personal history of other infectious and parasitic diseases: Secondary | ICD-10-CM | POA: Diagnosis not present

## 2022-11-01 DIAGNOSIS — Z87891 Personal history of nicotine dependence: Secondary | ICD-10-CM

## 2022-11-01 DIAGNOSIS — M0579 Rheumatoid arthritis with rheumatoid factor of multiple sites without organ or systems involvement: Secondary | ICD-10-CM | POA: Diagnosis not present

## 2022-11-01 DIAGNOSIS — M51369 Other intervertebral disc degeneration, lumbar region without mention of lumbar back pain or lower extremity pain: Secondary | ICD-10-CM

## 2022-11-01 DIAGNOSIS — M19042 Primary osteoarthritis, left hand: Secondary | ICD-10-CM

## 2022-11-01 DIAGNOSIS — M19041 Primary osteoarthritis, right hand: Secondary | ICD-10-CM | POA: Diagnosis not present

## 2022-11-01 DIAGNOSIS — Z8639 Personal history of other endocrine, nutritional and metabolic disease: Secondary | ICD-10-CM

## 2022-11-01 DIAGNOSIS — L409 Psoriasis, unspecified: Secondary | ICD-10-CM

## 2022-11-01 MED ORDER — LIDOCAINE HCL 1 % IJ SOLN
1.5000 mL | INTRAMUSCULAR | Status: AC | PRN
  Administered 2022-11-01: 1.5 mL

## 2022-11-01 MED ORDER — TRIAMCINOLONE ACETONIDE 40 MG/ML IJ SUSP
40.0000 mg | INTRAMUSCULAR | Status: AC | PRN
  Administered 2022-11-01: 40 mg via INTRA_ARTICULAR

## 2022-12-08 NOTE — Progress Notes (Deleted)
Office Visit Note  Patient: Amy Mccarthy             Date of Birth: [birth year]           MRN: [id number]             PCP: Redmond School, MD Referring: Redmond School, MD Visit Date: 12/19/2022 Occupation: @GUAROCC$ @  Subjective:  No chief complaint on file.   History of Present Illness: Amy Mccarthy is a 57 y.o. female ***     Activities of Daily Living:  Patient reports morning stiffness for *** {minute/hour:19697}.   Patient {ACTIONS;DENIES/REPORTS:21021675::"Denies"} nocturnal pain.  Difficulty dressing/grooming: {ACTIONS;DENIES/REPORTS:21021675::"Denies"} Difficulty climbing stairs: {ACTIONS;DENIES/REPORTS:21021675::"Denies"} Difficulty getting out of chair: {ACTIONS;DENIES/REPORTS:21021675::"Denies"} Difficulty using hands for taps, buttons, cutlery, and/or writing: {ACTIONS;DENIES/REPORTS:21021675::"Denies"}  No Rheumatology ROS completed.   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   Objective: Vital Signs: LMP 09/06/2011    Physical Exam   Musculoskeletal Exam: ***  CDAI Exam: CDAI Score: -- Patient Global: --; Provider Global: -- Swollen: --; Tender:  -- Joint Exam 12/19/2022   No joint exam has been documented for this visit   There is currently no information documented on the homunculus. Go to the Rheumatology activity and complete the homunculus joint exam.  Investigation: No additional findings.  Imaging: No results found.  Recent Labs: Lab Results  Component Value Date   WBC 6.7 06/11/2022   HGB 13.1 06/11/2022   PLT 268 06/11/2022   NA 142 07/11/2022   K 4.2 07/11/2022   CL 105 07/11/2022   CO2 28 07/11/2022   GLUCOSE 80 07/11/2022   BUN 12 07/11/2022   CREATININE 0.71 07/11/2022   BILITOT 0.6 07/11/2022   ALKPHOS 106 08/27/2011   AST 16 07/11/2022   ALT 20 07/11/2022   PROT 6.8 07/11/2022   ALBUMIN 3.9 08/27/2011   CALCIUM 9.2 07/11/2022   GFRAA 110 06/17/2019   QFTBGOLDPLUS NEGATIVE 07/11/2022    Speciality Comments: No specialty comments available.  Procedures:  No procedures performed Allergies: Patient has no known allergies.   Assessment / Plan:     Visit Diagnoses: No diagnosis found.  Orders: No orders of the defined types were placed in this encounter.  No orders of the defined types were placed in this encounter.   Face-to-face time spent with patient was *** minutes. Greater than 50% of time was spent in counseling and coordination of care.  Follow-Up Instructions: No follow-ups on file.   Earnestine Mealing, CMA  Note - This record has been created using Editor, commissioning.  Chart creation errors have been sought, but may not always  have been located. Such creation errors do not reflect on  the standard of medical care.

## 2022-12-19 ENCOUNTER — Ambulatory Visit: Payer: 59 | Admitting: Physician Assistant

## 2022-12-19 DIAGNOSIS — M19072 Primary osteoarthritis, left ankle and foot: Secondary | ICD-10-CM

## 2022-12-19 DIAGNOSIS — M19042 Primary osteoarthritis, left hand: Secondary | ICD-10-CM

## 2022-12-19 DIAGNOSIS — Z87891 Personal history of nicotine dependence: Secondary | ICD-10-CM

## 2022-12-19 DIAGNOSIS — R21 Rash and other nonspecific skin eruption: Secondary | ICD-10-CM

## 2022-12-19 DIAGNOSIS — M5136 Other intervertebral disc degeneration, lumbar region: Secondary | ICD-10-CM

## 2022-12-19 DIAGNOSIS — M0579 Rheumatoid arthritis with rheumatoid factor of multiple sites without organ or systems involvement: Secondary | ICD-10-CM

## 2022-12-19 DIAGNOSIS — M797 Fibromyalgia: Secondary | ICD-10-CM

## 2022-12-19 DIAGNOSIS — Z8619 Personal history of other infectious and parasitic diseases: Secondary | ICD-10-CM

## 2022-12-19 DIAGNOSIS — L03115 Cellulitis of right lower limb: Secondary | ICD-10-CM

## 2022-12-19 DIAGNOSIS — Z79899 Other long term (current) drug therapy: Secondary | ICD-10-CM

## 2022-12-19 DIAGNOSIS — M1711 Unilateral primary osteoarthritis, right knee: Secondary | ICD-10-CM

## 2022-12-19 DIAGNOSIS — L409 Psoriasis, unspecified: Secondary | ICD-10-CM

## 2022-12-19 DIAGNOSIS — L0100 Impetigo, unspecified: Secondary | ICD-10-CM

## 2022-12-19 DIAGNOSIS — Z8639 Personal history of other endocrine, nutritional and metabolic disease: Secondary | ICD-10-CM

## 2023-02-08 DIAGNOSIS — R519 Headache, unspecified: Secondary | ICD-10-CM | POA: Diagnosis not present

## 2023-02-08 DIAGNOSIS — Z6841 Body Mass Index (BMI) 40.0 and over, adult: Secondary | ICD-10-CM | POA: Diagnosis not present

## 2023-02-08 DIAGNOSIS — M069 Rheumatoid arthritis, unspecified: Secondary | ICD-10-CM | POA: Diagnosis not present

## 2023-02-08 DIAGNOSIS — E559 Vitamin D deficiency, unspecified: Secondary | ICD-10-CM | POA: Diagnosis not present

## 2023-02-08 DIAGNOSIS — M797 Fibromyalgia: Secondary | ICD-10-CM | POA: Diagnosis not present

## 2023-02-08 DIAGNOSIS — A6922 Other neurologic disorders in Lyme disease: Secondary | ICD-10-CM | POA: Diagnosis not present

## 2023-02-08 DIAGNOSIS — E063 Autoimmune thyroiditis: Secondary | ICD-10-CM | POA: Diagnosis not present

## 2023-02-10 ENCOUNTER — Encounter: Payer: Self-pay | Admitting: Neurology

## 2023-02-20 ENCOUNTER — Other Ambulatory Visit: Payer: Self-pay | Admitting: Family Medicine

## 2023-02-20 DIAGNOSIS — R519 Headache, unspecified: Secondary | ICD-10-CM

## 2023-02-20 DIAGNOSIS — G629 Polyneuropathy, unspecified: Secondary | ICD-10-CM

## 2023-02-27 DIAGNOSIS — G5601 Carpal tunnel syndrome, right upper limb: Secondary | ICD-10-CM | POA: Diagnosis not present

## 2023-02-27 DIAGNOSIS — G5603 Carpal tunnel syndrome, bilateral upper limbs: Secondary | ICD-10-CM | POA: Diagnosis not present

## 2023-03-20 ENCOUNTER — Ambulatory Visit
Admission: RE | Admit: 2023-03-20 | Discharge: 2023-03-20 | Disposition: A | Discharge status: Home / Self Care | Payer: 59 | Source: Ambulatory Visit | Attending: Family Medicine | Admitting: Family Medicine

## 2023-03-20 DIAGNOSIS — R519 Headache, unspecified: Secondary | ICD-10-CM | POA: Diagnosis not present

## 2023-03-20 DIAGNOSIS — G629 Polyneuropathy, unspecified: Secondary | ICD-10-CM

## 2023-04-21 DIAGNOSIS — Z6841 Body Mass Index (BMI) 40.0 and over, adult: Secondary | ICD-10-CM | POA: Diagnosis not present

## 2023-04-21 DIAGNOSIS — M5136 Other intervertebral disc degeneration, lumbar region: Secondary | ICD-10-CM | POA: Diagnosis not present

## 2023-04-21 DIAGNOSIS — E559 Vitamin D deficiency, unspecified: Secondary | ICD-10-CM | POA: Diagnosis not present

## 2023-04-21 DIAGNOSIS — L309 Dermatitis, unspecified: Secondary | ICD-10-CM | POA: Diagnosis not present

## 2023-04-21 DIAGNOSIS — R600 Localized edema: Secondary | ICD-10-CM | POA: Diagnosis not present

## 2023-04-21 DIAGNOSIS — M5442 Lumbago with sciatica, left side: Secondary | ICD-10-CM | POA: Diagnosis not present

## 2023-04-21 DIAGNOSIS — R519 Headache, unspecified: Secondary | ICD-10-CM | POA: Diagnosis not present

## 2023-04-26 ENCOUNTER — Emergency Department (HOSPITAL_COMMUNITY): Payer: 59

## 2023-04-26 ENCOUNTER — Emergency Department (HOSPITAL_COMMUNITY)
Admission: EM | Admit: 2023-04-26 | Discharge: 2023-04-26 | Disposition: A | Discharge status: Home / Self Care | Payer: 59 | Attending: Emergency Medicine | Admitting: Emergency Medicine

## 2023-04-26 ENCOUNTER — Encounter (HOSPITAL_COMMUNITY): Payer: Self-pay | Admitting: Family Medicine

## 2023-04-26 ENCOUNTER — Other Ambulatory Visit: Payer: Self-pay

## 2023-04-26 ENCOUNTER — Encounter (HOSPITAL_COMMUNITY): Payer: Self-pay | Admitting: *Deleted

## 2023-04-26 DIAGNOSIS — R930 Abnormal findings on diagnostic imaging of skull and head, not elsewhere classified: Secondary | ICD-10-CM | POA: Insufficient documentation

## 2023-04-26 DIAGNOSIS — J811 Chronic pulmonary edema: Secondary | ICD-10-CM | POA: Diagnosis not present

## 2023-04-26 DIAGNOSIS — R519 Headache, unspecified: Secondary | ICD-10-CM | POA: Diagnosis not present

## 2023-04-26 DIAGNOSIS — I5020 Unspecified systolic (congestive) heart failure: Secondary | ICD-10-CM

## 2023-04-26 DIAGNOSIS — R531 Weakness: Secondary | ICD-10-CM | POA: Insufficient documentation

## 2023-04-26 DIAGNOSIS — I110 Hypertensive heart disease with heart failure: Secondary | ICD-10-CM | POA: Diagnosis not present

## 2023-04-26 DIAGNOSIS — R0602 Shortness of breath: Secondary | ICD-10-CM | POA: Diagnosis not present

## 2023-04-26 DIAGNOSIS — I509 Heart failure, unspecified: Secondary | ICD-10-CM | POA: Diagnosis not present

## 2023-04-26 DIAGNOSIS — R200 Anesthesia of skin: Secondary | ICD-10-CM | POA: Insufficient documentation

## 2023-04-26 DIAGNOSIS — R600 Localized edema: Secondary | ICD-10-CM | POA: Diagnosis not present

## 2023-04-26 DIAGNOSIS — R509 Fever, unspecified: Secondary | ICD-10-CM | POA: Diagnosis not present

## 2023-04-26 LAB — HEPATIC FUNCTION PANEL
ALT: 32 U/L (ref 0–44)
AST: 28 U/L (ref 15–41)
Albumin: 4.3 g/dL (ref 3.5–5.0)
Alkaline Phosphatase: 106 U/L (ref 38–126)
Bilirubin, Direct: 0.1 mg/dL (ref 0.0–0.2)
Indirect Bilirubin: 0.8 mg/dL (ref 0.3–0.9)
Total Bilirubin: 0.9 mg/dL (ref 0.3–1.2)
Total Protein: 7.6 g/dL (ref 6.5–8.1)

## 2023-04-26 LAB — BASIC METABOLIC PANEL
Anion gap: 9 (ref 5–15)
BUN: 12 mg/dL (ref 6–20)
CO2: 25 mmol/L (ref 22–32)
Calcium: 9.2 mg/dL (ref 8.9–10.3)
Chloride: 104 mmol/L (ref 98–111)
Creatinine, Ser: 0.64 mg/dL (ref 0.44–1.00)
GFR, Estimated: >60 mL/min (ref >60)
Glucose, Bld: 103 mg/dL — ABNORMAL HIGH (ref 70–99)
Potassium: 4.6 mmol/L (ref 3.5–5.1)
Sodium: 138 mmol/L (ref 135–145)

## 2023-04-26 LAB — CBC WITH DIFFERENTIAL/PLATELET
Abs Immature Granulocytes: 0.02 10*3/uL (ref 0.00–0.07)
Basophils Absolute: 0.1 10*3/uL (ref 0.0–0.1)
Basophils Relative: 1 %
Eosinophils Absolute: 0.1 10*3/uL (ref 0.0–0.5)
Eosinophils Relative: 2 %
HCT: 41.2 % (ref 36.0–46.0)
Hemoglobin: 12.9 g/dL (ref 12.0–15.0)
Immature Granulocytes: 0 %
Lymphocytes Relative: 34 %
Lymphs Abs: 2.2 10*3/uL (ref 0.7–4.0)
MCH: 27.3 pg (ref 26.0–34.0)
MCHC: 31.3 g/dL (ref 30.0–36.0)
MCV: 87.3 fL (ref 80.0–100.0)
Monocytes Absolute: 0.3 10*3/uL (ref 0.1–1.0)
Monocytes Relative: 4 %
Neutro Abs: 3.9 10*3/uL (ref 1.7–7.7)
Neutrophils Relative %: 59 %
Platelets: 277 10*3/uL (ref 150–400)
RBC: 4.72 MIL/uL (ref 3.87–5.11)
RDW: 13.6 % (ref 11.5–15.5)
WBC: 6.6 10*3/uL (ref 4.0–10.5)
nRBC: 0 % (ref 0.0–0.2)

## 2023-04-26 LAB — TSH: TSH: 2.419 u[IU]/mL (ref 0.350–4.500)

## 2023-04-26 LAB — TROPONIN I (HIGH SENSITIVITY): Troponin I (High Sensitivity): 2 ng/L (ref <18)

## 2023-04-26 MED ORDER — METOCLOPRAMIDE HCL 5 MG/ML IJ SOLN
10.0000 mg | Freq: Once | INTRAMUSCULAR | Status: AC
  Administered 2023-04-26: 10 mg via INTRAVENOUS
  Filled 2023-04-26: qty 2

## 2023-04-26 MED ORDER — KETOROLAC TROMETHAMINE 30 MG/ML IJ SOLN
30.0000 mg | Freq: Once | INTRAMUSCULAR | Status: AC
  Administered 2023-04-26: 30 mg via INTRAVENOUS
  Filled 2023-04-26: qty 1

## 2023-04-26 MED ORDER — DIPHENHYDRAMINE HCL 50 MG/ML IJ SOLN
25.0000 mg | Freq: Once | INTRAMUSCULAR | Status: AC
  Administered 2023-04-26: 25 mg via INTRAVENOUS
  Filled 2023-04-26: qty 1

## 2023-04-26 NOTE — ED Provider Notes (Incomplete)
[place] EMERGENCY DEPARTMENT AT [facility] Provider Note   CSN: [id number] Arrival date & time: 04/26/23  1225     History {Add pertinent medical, surgical, social history, OB history to HPI:1} Chief Complaint  Patient presents with   Numbness    Amy Mccarthy is a 57 y.o. female.  Patient complains of weakness in her left arm and headache.  Weakness has not improved.  This has happened to her before and she has appoint with a neurologist.  Patient also complains of swelling in her ankle   Weakness      Home Medications Prior to Admission medications   Medication Sig Start Date End Date Taking? Authorizing Provider  APPLE CIDER VINEGAR PO Take by mouth daily. Patient not taking: Reported on 07/11/2022    [provider]  Ascorbic Acid (VITAMIN C PO) Take by mouth daily. Patient not taking: Reported on 07/11/2022    [provider]  Biotin w/ Vitamins C & E (HAIR/SKIN/NAILS PO) Take by mouth daily. Patient not taking: Reported on 07/11/2022    [provider]  buPROPion (WELLBUTRIN XL) 300 MG 24 hr tablet Take 300 mg by mouth daily.    [provider]  cephALEXin (KEFLEX) 500 MG capsule Take 1 capsule (500 mg total) by mouth 4 (four) times daily. Patient not taking: Reported on 07/11/2022 06/11/22   Ernie Avena, MD  folic acid (FOLVITE) 1 MG tablet Take 1 tablet (1 mg total) by mouth daily. Patient not taking: Reported on 07/11/2022 04/30/19   Pollyann Savoy, MD  furosemide (LASIX) 20 MG tablet Take 20 mg by mouth as needed. Patient not taking: Reported on 11/01/2022 08/21/22   [provider]  methotrexate (RHEUMATREX) 2.5 MG tablet Take 4 tablets (10 mg total) by mouth once a week. Caution:Chemotherapy. Protect from light. Patient not taking: Reported on 07/11/2022 04/30/19   Pollyann Savoy, MD  PREVIDENT 5000 BOOSTER PLUS 1.1 % PSTE BRUSH TEETH WITH PASTE BID 03/31/19   [provider]  Vitamin D,  Ergocalciferol, (DRISDOL) 1.25 MG (50000 UNIT) CAPS capsule Take 50,000 Units by mouth once a week. 08/29/22   [provider]      Allergies    Patient has no known allergies.    Review of Systems   Review of Systems  Neurological:  Positive for weakness.    Physical Exam Updated Vital Signs BP (!) 114/51   Pulse 73   Temp 97.9 F (36.6 C) (Oral)   Resp 19   Ht 5\' 2"  (1.575 m)   Wt 99.8 kg   LMP 09/06/2011   SpO2 99%   BMI 40.24 kg/m  Physical Exam  ED Results / Procedures / Treatments   Labs (all labs ordered are listed, but only abnormal results are displayed) Labs Reviewed  BASIC METABOLIC PANEL - Abnormal; Notable for the following components:      Result Value   Glucose, Bld 103 (*)    All other components within normal limits  CBC WITH DIFFERENTIAL/PLATELET  HEPATIC FUNCTION PANEL  TSH  TROPONIN I (HIGH SENSITIVITY)    EKG None  Radiology MR BRAIN WO CONTRAST  Result Date: 04/26/2023 CLINICAL DATA:  Headache, fever EXAM: MRI HEAD WITHOUT CONTRAST TECHNIQUE: Multiplanar, multiecho pulse sequences of the brain and surrounding structures were obtained without intravenous contrast. COMPARISON:  MRI Head 03/20/23 FINDINGS: Brain: Negative for an acute infarct. No hemorrhage. No hydrocephalus. No extra-axial fluid collection. There is sequela of minimal chronic microvascular ischemic change. Vascular: Normal flow  voids. Skull and upper cervical spine: Normal marrow signal. Sinuses/Orbits: No middle ear or mastoid effusion. Paranasal sinuses are notable for mucosal thickening in the bilateral ethmoid sinuses. Orbits are unremarkable. Other: None. IMPRESSION: No acute intracranial process. Electronically Signed   By: Lorenza Cambridge M.D.   On: 04/26/2023 14:32   DG Chest Port 1 View  Result Date: 04/26/2023 CLINICAL DATA:  Shortness of breath. EXAM: PORTABLE CHEST 1 VIEW COMPARISON:  July 12, 2012. FINDINGS: Normal cardiac size. Central pulmonary vascular  congestion is noted. Probable mild bilateral pulmonary edema is noted. The visualized skeletal structures are unremarkable. IMPRESSION: Mild central pulmonary vascular congestion with probable mild bilateral pulmonary edema. Electronically Signed   By: Lupita Raider M.D.   On: 04/26/2023 14:30    Procedures Procedures  {Document cardiac monitor, telemetry assessment procedure when appropriate:1}  Medications Ordered in ED Medications  ketorolac (TORADOL) 30 MG/ML injection 30 mg (30 mg Intravenous Given 04/26/23 1314)  metoCLOPramide (REGLAN) injection 10 mg (10 mg Intravenous Given 04/26/23 1315)  diphenhydrAMINE (BENADRYL) injection 25 mg (25 mg Intravenous Given 04/26/23 1312)    ED Course/ Medical Decision Making/ A&P   {   Click here for ABCD2, HEART and other calculatorsREFRESH Note before signing :1}                          Medical Decision Making Amount and/or Complexity of Data Reviewed Labs: ordered. Radiology: ordered. ECG/medicine tests: ordered.  Risk Prescription drug management.   Patient with peripheral edema and possible congestive heart failure.  She is going to take her Lasix 20 mg a day.  She is only taken 1 pill prior to this.  She is referred to cardiology for further workup  {Document critical care time when appropriate:1} {Document review of labs and clinical decision tools ie heart score, Chads2Vasc2 etc:1}  {Document your independent review of radiology images, and any outside records:1} {Document your discussion with family members, caretakers, and with consultants:1} {Document social determinants of health affecting pt's care:1} {Document your decision making why or why not admission, treatments were needed:1} Final Clinical Impression(s) / ED Diagnoses Final diagnoses:  None    Rx / DC Orders ED Discharge Orders     None

## 2023-04-26 NOTE — Discharge Instructions (Addendum)
Follow-up with Dr. Wyline Mood or one of his colleagues in the next couple weeks.

## 2023-04-26 NOTE — ED Notes (Signed)
Patient transported to MRI 

## 2023-04-26 NOTE — ED Notes (Signed)
Edp made aware of pt

## 2023-04-26 NOTE — ED Triage Notes (Signed)
Pt with left arm numbness and weakness to left arm, started today about 45 minutes ago. Left sided HA since last week.  Pt waiting to see neurologist for her HA. Recent head CT a month ago for same pressure to left head. Denies any CP or SOB at present.  Pt seen PCP on Friday and started on lasix for bilateral leg swelling.

## 2023-04-27 ENCOUNTER — Other Ambulatory Visit (HOSPITAL_COMMUNITY): Payer: Self-pay | Admitting: Family Medicine

## 2023-04-27 DIAGNOSIS — R600 Localized edema: Secondary | ICD-10-CM

## 2023-04-27 DIAGNOSIS — M5442 Lumbago with sciatica, left side: Secondary | ICD-10-CM

## 2023-05-08 ENCOUNTER — Ambulatory Visit (HOSPITAL_COMMUNITY): Payer: 59

## 2023-05-10 ENCOUNTER — Ambulatory Visit: Payer: 59 | Attending: Internal Medicine | Admitting: Internal Medicine

## 2023-05-10 ENCOUNTER — Encounter: Payer: Self-pay | Admitting: Internal Medicine

## 2023-05-10 VITALS — BP 122/74 | HR 68 | Ht 61.5 in | Wt 224.6 lb

## 2023-05-10 DIAGNOSIS — R609 Edema, unspecified: Secondary | ICD-10-CM | POA: Diagnosis not present

## 2023-05-10 NOTE — Patient Instructions (Signed)
Medication Instructions:  The current medical regimen is effective;  continue present plan and medications.  *If you need a refill on your cardiac medications before your next appointment, please call your pharmacy*   Follow-Up: At [facility], you and your health needs are our priority.  As part of our continuing mission to provide you with exceptional heart care, we have created designated Provider Care Teams.  These Care Teams include your primary Cardiologist (physician) and Advanced Practice Providers (APPs -  Physician Assistants and Nurse Practitioners) who all work together to provide you with the care you need, when you need it.  We recommend signing up for the patient portal called "MyChart".  Sign up information is provided on this After Visit Summary.  MyChart is used to connect with patients for Virtual Visits (Telemedicine).  Patients are able to view lab/test results, encounter notes, upcoming appointments, etc.  Non-urgent messages can be sent to your provider as well.   To learn more about what you can do with MyChart, go to [email or web address].    Your next appointment:   As needed  Provider:   Mary Branch, MD    

## 2023-05-10 NOTE — Progress Notes (Signed)
Cardiology Office Note:    Date:  05/10/2023   ID:  Amy Mccarthy, DOB [birth year], MRN [id number]  PCP:  Elfredia Nevins, MD   [facility] Providers Cardiologist:  None     Referring MD: Bethann Berkshire, MD   No chief complaint on file. LE edema  History of Present Illness:    Amy Mccarthy is a 57 y.o. female went to the ED for weakness in the L arm and headache. Noted some leg swelling also. Cxray noted vascular congestion/ edema. She was told she had CHF in the ED. She was not admitted. No BNP. She has no hx of coronary dx or CHF. She feels anxious/ possible panic attacks. She has a constellation of symptoms. HA/light sensitivity.. She has weakness on the L side and SOB. No orthopnea or PND.  She reported LE edema in 2017 and TTE and stress was normal.  Family hx - MGM had a CABG. Quit smoking 2 years ago, started at age 55. 1 PPD   Past Medical History:  Diagnosis Date   Arthritis    Rheumatoid arthritis(714.0) 8/13   Right shoulder pain    Shoulder pain, left    Thyroid disease     Past Surgical History:  Procedure Laterality Date   CERVICAL DISC SURGERY      Current Medications: Current Meds  Medication Sig   ALPRAZolam (XANAX) 0.25 MG tablet Take 0.25-0.5 mg by mouth 2 (two) times daily as needed.   furosemide (LASIX) 20 MG tablet Take 20 mg by mouth as needed.   PREVIDENT 5000 BOOSTER PLUS 1.1 % PSTE BRUSH TEETH WITH PASTE BID   Vitamin D, Ergocalciferol, (DRISDOL) 1.25 MG (50000 UNIT) CAPS capsule Take 50,000 Units by mouth once a week.     Allergies:   Patient has no known allergies.   Social History   Socioeconomic History   Marital status: Divorced    Spouse name: Not on file   Number of children: Not on file   Years of education: Not on file   Highest education level: Not on file  Occupational History   Not on file  Tobacco Use   Smoking status: Former    Packs/day: 1    Types: Cigarettes    Start date: 09/04/1981    Passive exposure:  Never   Smokeless tobacco: Never  Vaping Use   Vaping Use: Former   Devices: occ use   Substance and Sexual Activity   Alcohol use: Yes    Comment: rarely   Drug use: No   Sexual activity: Not on file  Other Topics Concern   Not on file  Social History Narrative   Not on file   Social Determinants of Health   Financial Resource Strain: Not on file  Food Insecurity: Not on file  Transportation Needs: Not on file  Physical Activity: Not on file  Stress: Not on file  Social Connections: Not on file     Family History: The patient's family history includes COPD in her brother and father; Diabetes in her father; Emphysema in her brother; Hypothyroidism in her maternal grandmother and mother.  ROS:   Please see the history of present illness.     All other systems reviewed and are negative.  EKGs/Labs/Other Studies Reviewed:    The following studies were reviewed today:   EKG:  EKG is  ordered today.  The ekg ordered today demonstrates   05/10/2023- NSR  Recent Labs: 04/26/2023: ALT 32; BUN 12; Creatinine, Ser  0.64; Hemoglobin 12.9; Platelets 277; Potassium 4.6; Sodium 138; TSH 2.419   Recent Lipid Panel No results found for: "CHOL", "TRIG", "HDL", "CHOLHDL", "VLDL", "LDLCALC", "LDLDIRECT"   Risk Assessment/Calculations:     Physical Exam:    VS:  BP 122/74 (BP Location: Left Arm, Patient Position: Sitting, Cuff Size: Large)   Pulse 68   Ht 5' 1.5" (1.562 m)   Wt 224 lb 9.6 oz (101.9 kg)   LMP 09/06/2011   SpO2 96%   BMI 41.75 kg/m     Wt Readings from Last 3 Encounters:  05/10/23 224 lb 9.6 oz (101.9 kg)  04/26/23 220 lb (99.8 kg)  11/01/22 218 lb 9.6 oz (99.2 kg)     GEN:  Well nourished, well developed in no acute distress HEENT: Normal NECK: No JVD; No carotid bruits LYMPHATICS: No lymphadenopathy CARDIAC: RRR, no murmurs, rubs, gallops RESPIRATORY:  Clear to auscultation without rales, wheezing or rhonchi  ABDOMEN: Soft, non-tender,  non-distended MUSCULOSKELETAL:  No edema; No deformity  SKIN: Warm and dry NEUROLOGIC:  Alert and oriented x 3 PSYCHIATRIC:  Normal affect   ASSESSMENT:    LE edema/Lymphedema: she has no signs of CHF. She was given lasix, can continue. Recommend compression stockings.  We discussed maintaining a healthy lifestyle based on Life's Essential 8 recommendations by the American Heart Association.  She's had a prior w/u with normal echo and stress.  PLAN:    In order of problems listed above:  Follow up PRN       Medication Adjustments/Labs and Tests Ordered: Current medicines are reviewed at length with the patient today.  Concerns regarding medicines are outlined above.  Orders Placed This Encounter  Procedures   EKG 12-Lead   No orders of the defined types were placed in this encounter.   Patient Instructions  Medication Instructions:  The current medical regimen is effective;  continue present plan and medications.  *If you need a refill on your cardiac medications before your next appointment, please call your pharmacy*    Follow-Up: At [facility], you and your health needs are our priority.  As part of our continuing mission to provide you with exceptional heart care, we have created designated Provider Care Teams.  These Care Teams include your primary Cardiologist (physician) and Advanced Practice Providers (APPs -  Physician Assistants and Nurse Practitioners) who all work together to provide you with the care you need, when you need it.  We recommend signing up for the patient portal called "MyChart".  Sign up information is provided on this After Visit Summary.  MyChart is used to connect with patients for Virtual Visits (Telemedicine).  Patients are able to view lab/test results, encounter notes, upcoming appointments, etc.  Non-urgent messages can be sent to your provider as well.   To learn more about what you can do with MyChart, go to  [email or web address].    Your next appointment:   As needed  Provider:   Carolan Clines, MD      Signed, Maisie Fus, MD  05/10/2023 12:16 PM    [facility]

## 2023-05-22 ENCOUNTER — Encounter (HOSPITAL_COMMUNITY): Payer: Self-pay

## 2023-05-22 ENCOUNTER — Ambulatory Visit: Payer: 59 | Admitting: Internal Medicine

## 2023-05-22 ENCOUNTER — Ambulatory Visit (HOSPITAL_COMMUNITY): Payer: 59

## 2023-05-22 DIAGNOSIS — M797 Fibromyalgia: Secondary | ICD-10-CM | POA: Diagnosis not present

## 2023-05-22 DIAGNOSIS — E063 Autoimmune thyroiditis: Secondary | ICD-10-CM | POA: Diagnosis not present

## 2023-05-22 DIAGNOSIS — R600 Localized edema: Secondary | ICD-10-CM | POA: Diagnosis not present

## 2023-05-22 DIAGNOSIS — F419 Anxiety disorder, unspecified: Secondary | ICD-10-CM | POA: Diagnosis not present

## 2023-05-22 DIAGNOSIS — Z6841 Body Mass Index (BMI) 40.0 and over, adult: Secondary | ICD-10-CM | POA: Diagnosis not present

## 2023-06-07 ENCOUNTER — Ambulatory Visit: Payer: 59 | Admitting: Neurology

## 2023-06-07 NOTE — Progress Notes (Signed)
NEUROLOGY FOLLOW UP OFFICE NOTE  Amy Mccarthy [id number]  Assessment/Plan:   Panic attacks Migraine with aura, without status migrainosus, not intractable  While I do think she is experiencing panic attacks, in addition, she is likely having migraines as well.  Instead of Wellbutrin, will have her start venlafaxine XR to treat for both anxiety and migraines:  37.5mg  daily for one week, then 75mg  daily.  We can increase dose in 8 weeks if needed. Limit use of pain relievers to no more than 2 days out of week to prevent risk of rebound or medication-overuse headache. Keep headache diary Follow up 6 months.  Subjective:  Amy Mccarthy is a 57 year old female with rheumatoid arthritis, autoimmune thyroiditis, polyneuropathy, fibromyalgia and history of cervical spine ACDF who presents for headache.  History supplemented by referring provider's note.  Brain MRIs personally reviewed.  Onset:  2021.  Becoming more frequent. Location:  bilateral retro-orbital; left frontal Quality:  pressure/tightness; sharp or dull/tight.  "Crazy feeling in my head" Intensity:  5/10.   Aura:  sometimes blurred/squiggly lines Prodrome:  absent Associated symptoms:  Photophobia, nausea, feels like "in a fog".  She feels anxious/impending doom/chest discomfort when it occurs.  She denies associated unilateral numbness or weakness. Duration:  she does not know Frequency:  4 days a week.   Frequency of abortive medication: none Triggers/Aggravating factors:  Laying supine increased the pain/pressure in the head and behind the eyes. Relieving factors:  none   Outpatient MRI of brain without contrast performed on 03/20/2023 was normal.  She also notes ongoing numbness and tingling on the left side.  Her left arm feels weak and numb.  She has bilateral carpal tunnel syndrome (severe right, mild left) diagnosed in November 2023.  Sometimes she has burning in the left lower abdominal quadrant into the left  thigh.  On 04/26/2023, she had headache accompanied by left arm numbness and weakness.  Seen in ED.  Repeat MRI of brain without contrast was unremarkable.    Eye exam today was unremarkable.    Being treated for panic attacks.  Also more irritable.  On alprazolam.  She was previously on Wellbutrin.  Plan is to restart it.  Past NSAIDS/analgesics:  none Past abortive triptans:  none Past abortive ergotamine:  none Past muscle relaxants:  Robaxin, Zanaflex Past anti-emetic:  none Past antihypertensive medications:  none Past antidepressant medications:  Wellbutrin Past anticonvulsant medications:  none Past anti-CGRP:  none Past vitamins/Herbal/Supplements:  none Past antihistamines/decongestants:  none Other past therapies:  none  Current NSAIDS/analgesics:  none Current triptans:  none Current ergotamine:  none Current anti-emetic:  none Current muscle relaxants:  none Current Antihypertensive medications:  furosemide (for lower extremity edema) Current Antidepressant medications:  none Current Anticonvulsant medications:  none Current anti-CGRP:  none Current Vitamins/Herbal/Supplements:  biotin, C Current Antihistamines/Decongestants:  none Other therapy:  alprazolam PRN    Depression:  no; Anxiety:  significant Other pain:  chronic neck and back pain.  Has RA Family history of headache:  no   PAST MEDICAL HISTORY: Past Medical History:  Diagnosis Date   Arthritis    Rheumatoid arthritis(714.0) 8/13   Right shoulder pain    Shoulder pain, left    Thyroid disease     MEDICATIONS: Current Outpatient Medications on File Prior to Visit  Medication Sig Dispense Refill   ALPRAZolam (XANAX) 0.25 MG tablet Take 0.25-0.5 mg by mouth 2 (two) times daily as needed.     APPLE CIDER VINEGAR  PO Take by mouth daily. (Patient not taking: Reported on 05/10/2023)     Ascorbic Acid (VITAMIN C PO) Take by mouth daily. (Patient not taking: Reported on 05/10/2023)     atorvastatin  (LIPITOR) 10 MG tablet Take 10 mg by mouth daily. (Patient not taking: Reported on 05/10/2023)     Biotin w/ Vitamins C & E (HAIR/SKIN/NAILS PO) Take by mouth daily. (Patient not taking: Reported on 05/10/2023)     buPROPion (WELLBUTRIN XL) 300 MG 24 hr tablet Take 300 mg by mouth daily. (Patient not taking: Reported on 05/10/2023)     cephALEXin (KEFLEX) 500 MG capsule Take 1 capsule (500 mg total) by mouth 4 (four) times daily. (Patient not taking: Reported on 05/10/2023) 20 capsule 0   folic acid (FOLVITE) 1 MG tablet Take 1 tablet (1 mg total) by mouth daily. (Patient not taking: Reported on 05/10/2023) 90 tablet 3   furosemide (LASIX) 20 MG tablet Take 20 mg by mouth as needed.     methotrexate (RHEUMATREX) 2.5 MG tablet Take 4 tablets (10 mg total) by mouth once a week. Caution:Chemotherapy. Protect from light. (Patient not taking: Reported on 05/10/2023) 48 tablet 0   PREVIDENT 5000 BOOSTER PLUS 1.1 % PSTE BRUSH TEETH WITH PASTE BID     Vitamin D, Ergocalciferol, (DRISDOL) 1.25 MG (50000 UNIT) CAPS capsule Take 50,000 Units by mouth once a week.     No current facility-administered medications on file prior to visit.    ALLERGIES: No Known Allergies  FAMILY HISTORY: Family History  Problem Relation Age of Onset   Hypothyroidism Mother    Hypothyroidism Maternal Grandmother    Diabetes Father    COPD Father    Emphysema Brother    COPD Brother       Objective:  Blood pressure 126/61, pulse 91, height 5\' 2"  (1.575 m), weight 232 lb (105.2 kg), last menstrual period 09/06/2011, SpO2 97 %. General: No acute distress.  Patient appears well-groomed.   Head:  Normocephalic/atraumatic Eyes:  Fundi examined but not visualized Neck: supple, no paraspinal tenderness, full range of motion Heart:  Regular rate and rhythm Lungs:  Clear to auscultation bilaterally Back: No paraspinal tenderness Neurological Exam: alert and oriented.  Speech fluent and not dysarthric, language intact.  CN II-XII  intact. Bulk and tone normal, muscle strength 5/5 throughout.  Sensation to light touch intact.  Deep tendon reflexes 2+ throughout, toes downgoing.  Finger to nose testing intact.  Gait normal, Romberg negative.   Amy Millet, DO  CC:  Amy Found, MD  Elfredia Nevins, MD

## 2023-06-12 ENCOUNTER — Ambulatory Visit: Payer: 59 | Admitting: Neurology

## 2023-06-12 ENCOUNTER — Encounter: Payer: Self-pay | Admitting: Neurology

## 2023-06-12 VITALS — BP 126/61 | HR 91 | Ht 62.0 in | Wt 232.0 lb

## 2023-06-12 DIAGNOSIS — F410 Panic disorder [episodic paroxysmal anxiety] without agoraphobia: Secondary | ICD-10-CM

## 2023-06-12 DIAGNOSIS — H04123 Dry eye syndrome of bilateral lacrimal glands: Secondary | ICD-10-CM | POA: Diagnosis not present

## 2023-06-12 DIAGNOSIS — G43009 Migraine without aura, not intractable, without status migrainosus: Secondary | ICD-10-CM

## 2023-06-12 MED ORDER — VENLAFAXINE HCL ER 75 MG PO CP24
75.0000 mg | ORAL_CAPSULE | Freq: Every day | ORAL | 5 refills | Status: DC
Start: 2023-06-12 — End: 2023-12-18

## 2023-06-12 MED ORDER — VENLAFAXINE HCL ER 37.5 MG PO CP24: 37.5000 mg | ORAL_CAPSULE | Freq: Every day | ORAL | 0 refills | Status: DC

## 2023-06-12 NOTE — Patient Instructions (Signed)
Don't start the Wellbutrin Instead start venlafaxine: Take venlafaxine XR 37.5mg  daily for one week Then start the venlafaxine XR 75mg  daily If no improvement in 8 weeks, contact me and we can still increase dose Limit use of pain relievers to no more than 2 days out of week to prevent risk of rebound or medication-overuse headache. Keep headache diary Follow up 6 months.

## 2023-07-04 ENCOUNTER — Other Ambulatory Visit: Payer: Self-pay | Admitting: Neurology

## 2023-07-24 DIAGNOSIS — Z6841 Body Mass Index (BMI) 40.0 and over, adult: Secondary | ICD-10-CM | POA: Diagnosis not present

## 2023-07-24 DIAGNOSIS — Z01419 Encounter for gynecological examination (general) (routine) without abnormal findings: Secondary | ICD-10-CM | POA: Diagnosis not present

## 2023-07-24 DIAGNOSIS — Z1231 Encounter for screening mammogram for malignant neoplasm of breast: Secondary | ICD-10-CM | POA: Diagnosis not present

## 2023-09-11 DIAGNOSIS — Z0001 Encounter for general adult medical examination with abnormal findings: Secondary | ICD-10-CM | POA: Diagnosis not present

## 2023-09-11 DIAGNOSIS — F419 Anxiety disorder, unspecified: Secondary | ICD-10-CM | POA: Diagnosis not present

## 2023-09-11 DIAGNOSIS — E063 Autoimmune thyroiditis: Secondary | ICD-10-CM | POA: Diagnosis not present

## 2023-09-11 DIAGNOSIS — E785 Hyperlipidemia, unspecified: Secondary | ICD-10-CM | POA: Diagnosis not present

## 2023-09-11 DIAGNOSIS — Z6841 Body Mass Index (BMI) 40.0 and over, adult: Secondary | ICD-10-CM | POA: Diagnosis not present

## 2023-09-11 DIAGNOSIS — A6922 Other neurologic disorders in Lyme disease: Secondary | ICD-10-CM | POA: Diagnosis not present

## 2023-09-11 DIAGNOSIS — M069 Rheumatoid arthritis, unspecified: Secondary | ICD-10-CM | POA: Diagnosis not present

## 2023-09-11 DIAGNOSIS — L309 Dermatitis, unspecified: Secondary | ICD-10-CM | POA: Diagnosis not present

## 2023-09-11 DIAGNOSIS — E559 Vitamin D deficiency, unspecified: Secondary | ICD-10-CM | POA: Diagnosis not present

## 2023-09-11 DIAGNOSIS — Z1331 Encounter for screening for depression: Secondary | ICD-10-CM | POA: Diagnosis not present

## 2023-09-25 DIAGNOSIS — G5603 Carpal tunnel syndrome, bilateral upper limbs: Secondary | ICD-10-CM | POA: Diagnosis not present

## 2023-09-25 DIAGNOSIS — L309 Dermatitis, unspecified: Secondary | ICD-10-CM | POA: Diagnosis not present

## 2023-09-25 DIAGNOSIS — R21 Rash and other nonspecific skin eruption: Secondary | ICD-10-CM | POA: Diagnosis not present

## 2023-12-17 NOTE — Progress Notes (Signed)
 NEUROLOGY FOLLOW UP OFFICE NOTE  KARINA NOFSINGER [id number]  Assessment/Plan:   Migraine with aura, without status migrainosus, not intractable Essential tremor - at this time, not frequent and manageable, even during work.   Panic attacks Chronic pain/rheumatoid arthritis  Discontinue venlafaxine .  Instead, start duloxetine  30mg  daily to treat migraines, anxiety as well as may benefit chronic pain.  We can increase to 60mg  daily in 6 to 8 weeks if needed. Monitor tremor for now.  If needed, would start primidone.  She would like to avoid propranolol due to low blood pressure. Limit use of pain relievers to no more than 2 days out of week to prevent risk of rebound or medication-overuse headache. Keep headache diary Follow up 6 months.  Subjective:  NYIAH PIANKA is a 58 year old female with rheumatoid arthritis, autoimmune thyroiditis, polyneuropathy, fibromyalgia and history of cervical spine ACDF who follows up for migraines.  UPDATE: Started venlafaxine .  She remained on 37.5mg  daily (75mg  caused excessive sweating).  She still notes feeling hot.   Intensity:  4/10 Duration:  off and on all day Frequency:  2 times a month May have a strange sensation in her eyes lasting for a week at a time.   Since starting venlafaxine , she reports less panic attacks.   But still feels hot even on lower dose.    She reports history of hand tremor for a while.  It is now more noticeable.  Primarily in right hand.  Occurs with action such as while cutting hair.  She reports that her father has tremors and her paternal grandfather had Parkinson's disease.  Current NSAIDS/analgesics:  none Current triptans:  none Current ergotamine:  none Current anti-emetic:  none Current muscle relaxants:  none Current Antihypertensive medications:  furosemide (for lower extremity edema) Current Antidepressant medications:  venlafaxine  XR 75mg  daily Current Anticonvulsant medications:  none Current  anti-CGRP:  none Current Vitamins/Herbal/Supplements:  biotin, C Current Antihistamines/Decongestants:  none Other therapy:  alprazolam PRN    Depression:  no; Anxiety:  significant Other pain:  chronic neck and back pain.  Has RA  HISTORY: Onset:  2021.  Becoming more frequent. Location:  bilateral retro-orbital; left frontal/temporal/facial Quality:  pressure/tightness; sharp, throbbing or dull/tight.  Crazy feeling in my head Intensity:  5/10.   Aura:  sometimes blurred/squiggly lines Prodrome:  absent Associated symptoms:  Photophobia, nausea, feels like in a fog.  She feels anxious/impending doom/chest discomfort when it occurs.  She denies associated unilateral numbness or weakness. Duration:  she does not know Frequency:  4 days a week.   Frequency of abortive medication: none Triggers/Aggravating factors:  Laying supine increased the pain/pressure in the head and behind the eyes. Relieving factors:  none   Outpatient MRI of brain without contrast performed on 03/20/2023 was normal.  She also notes ongoing numbness and tingling on the left side.  Her left arm feels weak and numb.  She has bilateral carpal tunnel syndrome (severe right, mild left) diagnosed in November 2023.  Sometimes she has burning in the left lower abdominal quadrant into the left thigh.  On 04/26/2023, she had headache accompanied by left arm numbness and weakness.  Seen in ED.  Repeat MRI of brain without contrast was unremarkable.    Eye exam today was unremarkable.    Being treated for panic attacks.  Also more irritable.  On alprazolam.  She was previously on Wellbutrin.  Plan is to restart it.  Past NSAIDS/analgesics:  none Past abortive triptans:  none Past abortive ergotamine:  none Past muscle relaxants:  Robaxin, Zanaflex Past anti-emetic:  none Past antihypertensive medications:  none Past antidepressant medications:  Wellbutrin Past anticonvulsant medications:  none Past anti-CGRP:   none Past vitamins/Herbal/Supplements:  none Past antihistamines/decongestants:  none Other past therapies:  none  Family history of headache:  no   PAST MEDICAL HISTORY: Past Medical History:  Diagnosis Date   Arthritis    Eczema    Rheumatoid arthritis(714.0) 07/2012   Right shoulder pain    Shoulder pain, left    Thyroid  disease     MEDICATIONS: Current Outpatient Medications on File Prior to Visit  Medication Sig Dispense Refill   ALPRAZolam (XANAX) 0.25 MG tablet Take 0.25-0.5 mg by mouth 2 (two) times daily as needed.     Ascorbic Acid (VITAMIN C PO) Take by mouth daily.     atorvastatin (LIPITOR) 10 MG tablet Take 10 mg by mouth daily.     Biotin w/ Vitamins C & E (HAIR/SKIN/NAILS PO) Take by mouth daily.     buPROPion (WELLBUTRIN XL) 300 MG 24 hr tablet Take 300 mg by mouth daily.     cephALEXin  (KEFLEX ) 500 MG capsule Take 1 capsule (500 mg total) by mouth 4 (four) times daily. 20 capsule 0   folic acid  (FOLVITE ) 1 MG tablet Take 1 tablet (1 mg total) by mouth daily. 90 tablet 3   furosemide (LASIX) 20 MG tablet Take 20 mg by mouth as needed.     PREVIDENT 5000 BOOSTER PLUS 1.1 % PSTE BRUSH TEETH WITH PASTE BID     venlafaxine  XR (EFFEXOR  XR) 37.5 MG 24 hr capsule Take 1 capsule (37.5 mg total) by mouth daily with breakfast. 7 capsule 0   venlafaxine  XR (EFFEXOR  XR) 75 MG 24 hr capsule Take 1 capsule (75 mg total) by mouth daily with breakfast. 30 capsule 5   Vitamin D, Ergocalciferol, (DRISDOL) 1.25 MG (50000 UNIT) CAPS capsule Take 50,000 Units by mouth once a week.     No current facility-administered medications on file prior to visit.    ALLERGIES: No Known Allergies  FAMILY HISTORY: Family History  Problem Relation Age of Onset   Hypothyroidism Mother    Diabetes Father    COPD Father    Emphysema Brother    COPD Brother    Dementia Maternal Aunt    Hypothyroidism Maternal Grandmother    Parkinsonism Paternal Grandfather       Objective:  Blood  pressure (!) 123/57, pulse 78, height 5' 2 (1.575 m), weight 232 lb 12.8 oz (105.6 kg), last menstrual period 09/06/2011. General: No acute distress.  Patient appears well-groomed.   Head:  Normocephalic/atraumatic Eyes:  Fundi examined but not visualized Neck: supple, no paraspinal tenderness, full range of motion Heart:  Regular rate and rhythm Neurological Exam: alert and oriented.  Speech fluent and not dysarthric, language intact.  CN II-XII intact. Bulk and tone normal, muscle strength 5/5 throughout.  No tremor appreciated.  No rigidity or bradykinesia.  Sensation to light touch intact.  Deep tendon reflexes 2+ throughout.  Finger to nose testing intact.  Gait normal, Romberg negative.    Juliene Dunnings, DO  CC:  Jerilynn Carnes, MD

## 2023-12-18 ENCOUNTER — Ambulatory Visit: Payer: Medicaid Other | Admitting: Neurology

## 2023-12-18 ENCOUNTER — Encounter: Payer: Self-pay | Admitting: Neurology

## 2023-12-18 VITALS — BP 123/57 | HR 78 | Ht 62.0 in | Wt 232.8 lb

## 2023-12-18 DIAGNOSIS — G43009 Migraine without aura, not intractable, without status migrainosus: Secondary | ICD-10-CM | POA: Diagnosis not present

## 2023-12-18 DIAGNOSIS — F410 Panic disorder [episodic paroxysmal anxiety] without agoraphobia: Secondary | ICD-10-CM | POA: Diagnosis not present

## 2023-12-18 DIAGNOSIS — G250 Essential tremor: Secondary | ICD-10-CM | POA: Diagnosis not present

## 2023-12-18 DIAGNOSIS — G894 Chronic pain syndrome: Secondary | ICD-10-CM

## 2023-12-18 MED ORDER — DULOXETINE HCL 30 MG PO CPEP: 30.0000 mg | ORAL_CAPSULE | Freq: Every day | ORAL | 5 refills | Status: DC

## 2023-12-18 NOTE — Patient Instructions (Signed)
 Stop venlafaxine.  Start duloxetine 30mg  daily.  If no improvement in 6-8 weeks, contact me and we can increase dose.  This will hopefully address migraines, anxiety and pain.  Monitor tremors

## 2024-01-01 DIAGNOSIS — M5442 Lumbago with sciatica, left side: Secondary | ICD-10-CM | POA: Diagnosis not present

## 2024-01-01 DIAGNOSIS — R600 Localized edema: Secondary | ICD-10-CM | POA: Diagnosis not present

## 2024-01-01 DIAGNOSIS — L309 Dermatitis, unspecified: Secondary | ICD-10-CM | POA: Diagnosis not present

## 2024-01-01 DIAGNOSIS — M069 Rheumatoid arthritis, unspecified: Secondary | ICD-10-CM | POA: Diagnosis not present

## 2024-01-01 DIAGNOSIS — Z6841 Body Mass Index (BMI) 40.0 and over, adult: Secondary | ICD-10-CM | POA: Diagnosis not present

## 2024-01-02 ENCOUNTER — Other Ambulatory Visit (HOSPITAL_COMMUNITY): Payer: Self-pay | Admitting: Family Medicine

## 2024-01-02 DIAGNOSIS — R600 Localized edema: Secondary | ICD-10-CM

## 2024-01-02 DIAGNOSIS — M5442 Lumbago with sciatica, left side: Secondary | ICD-10-CM

## 2024-01-09 ENCOUNTER — Ambulatory Visit (HOSPITAL_COMMUNITY)
Admission: RE | Admit: 2024-01-09 | Discharge: 2024-01-09 | Disposition: A | Discharge status: Home / Self Care | Payer: Medicaid Other | Source: Ambulatory Visit | Attending: Family Medicine | Admitting: Family Medicine

## 2024-01-09 DIAGNOSIS — R600 Localized edema: Secondary | ICD-10-CM | POA: Insufficient documentation

## 2024-01-09 DIAGNOSIS — M47817 Spondylosis without myelopathy or radiculopathy, lumbosacral region: Secondary | ICD-10-CM | POA: Diagnosis not present

## 2024-01-09 DIAGNOSIS — M5442 Lumbago with sciatica, left side: Secondary | ICD-10-CM | POA: Insufficient documentation

## 2024-01-09 DIAGNOSIS — M5116 Intervertebral disc disorders with radiculopathy, lumbar region: Secondary | ICD-10-CM | POA: Diagnosis not present

## 2024-01-09 DIAGNOSIS — M5117 Intervertebral disc disorders with radiculopathy, lumbosacral region: Secondary | ICD-10-CM | POA: Diagnosis not present

## 2024-01-09 DIAGNOSIS — M47816 Spondylosis without myelopathy or radiculopathy, lumbar region: Secondary | ICD-10-CM | POA: Diagnosis not present

## 2024-04-01 ENCOUNTER — Telehealth: Payer: Self-pay

## 2024-04-01 ENCOUNTER — Other Ambulatory Visit: Payer: Self-pay | Admitting: Neurology

## 2024-04-01 DIAGNOSIS — F410 Panic disorder [episodic paroxysmal anxiety] without agoraphobia: Secondary | ICD-10-CM

## 2024-04-01 DIAGNOSIS — M48062 Spinal stenosis, lumbar region with neurogenic claudication: Secondary | ICD-10-CM | POA: Diagnosis not present

## 2024-04-01 DIAGNOSIS — G894 Chronic pain syndrome: Secondary | ICD-10-CM

## 2024-04-01 DIAGNOSIS — G250 Essential tremor: Secondary | ICD-10-CM

## 2024-04-01 DIAGNOSIS — G43009 Migraine without aura, not intractable, without status migrainosus: Secondary | ICD-10-CM

## 2024-04-01 MED ORDER — DULOXETINE HCL 30 MG PO CPEP
30.0000 mg | ORAL_CAPSULE | Freq: Every day | ORAL | 3 refills | Status: AC
Start: 2024-04-01 — End: ?

## 2024-04-01 NOTE — Telephone Encounter (Signed)
 Per Letter received from [employer]   Cheaper for the patient to get duloxetine  at a 90 day supply,If agree please send a 90 day supply to patient's local [facility].

## 2024-04-01 NOTE — Telephone Encounter (Signed)
 90 day supply sent in per Dr.Hill

## 2024-04-11 DIAGNOSIS — Z6839 Body mass index (BMI) 39.0-39.9, adult: Secondary | ICD-10-CM | POA: Diagnosis not present

## 2024-04-11 DIAGNOSIS — E6609 Other obesity due to excess calories: Secondary | ICD-10-CM | POA: Diagnosis not present

## 2024-04-11 DIAGNOSIS — B372 Candidiasis of skin and nail: Secondary | ICD-10-CM | POA: Diagnosis not present

## 2024-04-15 DIAGNOSIS — M48062 Spinal stenosis, lumbar region with neurogenic claudication: Secondary | ICD-10-CM | POA: Diagnosis not present

## 2024-05-06 DIAGNOSIS — M48062 Spinal stenosis, lumbar region with neurogenic claudication: Secondary | ICD-10-CM | POA: Diagnosis not present

## 2024-06-21 NOTE — Progress Notes (Signed)
 NEUROLOGY FOLLOW UP OFFICE NOTE  Amy Mccarthy [id number]  Assessment/Plan:   Migraine with aura, without status migrainosus, not intractable Essential tremor - at this time, not frequent and manageable, even during work.   Panic attacks Chronic pain/rheumatoid arthritis  Duloxetine  30mg  daily to treat migraines, anxiety as well as may benefit chronic pain.   Monitor tremor for now.  If needed, would start primidone.  She would like to avoid propranolol due to low blood pressure.  Limit use of pain relievers to no more than 9 days out of the month to prevent risk of rebound or medication-overuse headache. Keep headache diary Follow up 1 year  Subjective:  Amy Mccarthy is a 58 year old female with rheumatoid arthritis, autoimmune thyroiditis, polyneuropathy, fibromyalgia, anxiety/panic attacks and history of cervical spine ACDF who follows up for migraines.  UPDATE: Migraines: Changed from venlafaxine  to duloxetine . Still notes feeling hot at times but not as severe.  It also helps with her chronic pain. Intensity:  4/10 Duration:  off and on all day Frequency:  Once a month May have a strange sensation in her eyes lasting for a week at a time.   Essential tremor: Varies.  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  It has not negatively impacted her job.    Current NSAIDS/analgesics:  none Current triptans:  none Current ergotamine:  none Current anti-emetic:  none Current muscle relaxants:  none Current Antihypertensive medications:  furosemide (for lower extremity edema) Current Antidepressant medications:  duloxetine  30mg  daily Current Anticonvulsant medications:  none Current anti-CGRP:  none Current Vitamins/Herbal/Supplements:  biotin, C Current Antihistamines/Decongestants:  none Other therapy:  alprazolam PRN  Depression:  no; Anxiety:  significant Other pain:  chronic neck and back pain.  Has RA  HISTORY: Migraines: Onset:  2021.   Becoming more frequent. Location:  bilateral retro-orbital; left frontal/temporal/facial Quality:  pressure/tightness; sharp, throbbing or dull/tight.  Crazy feeling in my head Intensity:  5/10.   Aura:  sometimes blurred/squiggly lines Prodrome:  absent Associated symptoms:  Photophobia, nausea, feels like in a fog.  She feels anxious/impending doom/chest discomfort when it occurs.  She denies associated unilateral numbness or weakness. Duration:  she does not know Frequency:  4 days a week.   Frequency of abortive medication: none Triggers/Aggravating factors:  Laying supine increased the pain/pressure in the head and behind the eyes. Relieving factors:  none  Essential tremor: She reports history of hand tremor for a while.  It is now more noticeable.  Primarily in right hand.  Occurs with action such as while cutting hair.  She reports that her father has tremors and her paternal grandfather had Parkinson's disease.   Outpatient MRI of brain without contrast performed on 03/20/2023 was normal.  She also notes ongoing numbness and tingling on the left side.  Her left arm feels weak and numb.  She has bilateral carpal tunnel syndrome (severe right, mild left) diagnosed in November 2023.  Sometimes she has burning in the left lower abdominal quadrant into the left thigh.  On 04/26/2023, she had headache accompanied by left arm numbness and weakness.  Seen in ED.  Repeat MRI of brain without contrast was unremarkable.    Eye exam was unremarkable.    Past NSAIDS/analgesics:  none Past abortive triptans:  none Past abortive ergotamine:  none Past muscle relaxants:  Robaxin, Zanaflex Past anti-emetic:  none Past antihypertensive medications:  none Past antidepressant medications:  Venlafaxine  (excessive sweating), Wellbutrin Past anticonvulsant  medications:  none Past anti-CGRP:  none Past vitamins/Herbal/Supplements:  none Past antihistamines/decongestants:  none Other past  therapies:  none  Family history of headache:  no   PAST MEDICAL HISTORY: Past Medical History:  Diagnosis Date   Arthritis    Eczema    Rheumatoid arthritis(714.0) 07/2012   Right shoulder pain    Shoulder pain, left    Thyroid  disease     MEDICATIONS: Current Outpatient Medications on File Prior to Visit  Medication Sig Dispense Refill   ALPRAZolam (XANAX) 0.25 MG tablet Take 0.25-0.5 mg by mouth 2 (two) times daily as needed.     Biotin w/ Vitamins C & E (HAIR/SKIN/NAILS PO) Take by mouth daily.     DULoxetine  (CYMBALTA ) 30 MG capsule Take 1 capsule (30 mg total) by mouth daily. 90 capsule 3   PREVIDENT 5000 BOOSTER PLUS 1.1 % PSTE BRUSH TEETH WITH PASTE BID     atorvastatin (LIPITOR) 10 MG tablet Take 10 mg by mouth daily. (Patient not taking: Reported on 06/24/2024)     No current facility-administered medications on file prior to visit.     ALLERGIES: No Known Allergies  FAMILY HISTORY: Family History  Problem Relation Age of Onset   Hypothyroidism Mother    Diabetes Father    COPD Father    Emphysema Brother    COPD Brother    Dementia Maternal Aunt    Hypothyroidism Maternal Grandmother    Parkinsonism Paternal Grandfather       Objective:  Blood pressure 119/76, pulse 75, height 5' 1 (1.549 m), weight 211 lb (95.7 kg), last menstrual period 09/06/2011, SpO2 99%. General: No acute distress.  Patient appears well-groomed.   Head:  Normocephalic/atraumatic Neck:  Supple.  No paraspinal tenderness.  Full range of motion. Heart:  Regular rate and rhythm. Neuro:  Alert and oriented.  Speech fluent and not dysarthric.  Language intact.  CN II-XII intact.  Bulk and tone normal.  Muscle strength 5/5 throughout.  Very slight right greater than left postural tremor in hands.  No rigidity or bradykinesia.  Sensation to light touch intact.  Deep tendon reflexes 2+ throughout, toes downgoing.  Gait normal.  Romberg negative.     Juliene Dunnings, DO  CC:  Jerilynn Carnes, MD

## 2024-06-24 ENCOUNTER — Ambulatory Visit: Payer: Medicaid Other | Admitting: Neurology

## 2024-06-24 ENCOUNTER — Encounter: Payer: Self-pay | Admitting: Neurology

## 2024-06-24 VITALS — BP 119/76 | HR 75 | Ht 61.0 in | Wt 211.0 lb

## 2024-06-24 DIAGNOSIS — G43009 Migraine without aura, not intractable, without status migrainosus: Secondary | ICD-10-CM | POA: Diagnosis not present

## 2024-06-24 DIAGNOSIS — G250 Essential tremor: Secondary | ICD-10-CM | POA: Diagnosis not present

## 2024-07-12 DIAGNOSIS — F419 Anxiety disorder, unspecified: Secondary | ICD-10-CM | POA: Diagnosis not present

## 2024-07-12 DIAGNOSIS — R7309 Other abnormal glucose: Secondary | ICD-10-CM | POA: Diagnosis not present

## 2024-07-12 DIAGNOSIS — L309 Dermatitis, unspecified: Secondary | ICD-10-CM | POA: Diagnosis not present

## 2024-07-12 DIAGNOSIS — Z6838 Body mass index (BMI) 38.0-38.9, adult: Secondary | ICD-10-CM | POA: Diagnosis not present

## 2024-07-12 DIAGNOSIS — E6609 Other obesity due to excess calories: Secondary | ICD-10-CM | POA: Diagnosis not present

## 2024-07-29 DIAGNOSIS — Z124 Encounter for screening for malignant neoplasm of cervix: Secondary | ICD-10-CM | POA: Diagnosis not present

## 2024-07-29 DIAGNOSIS — Z6839 Body mass index (BMI) 39.0-39.9, adult: Secondary | ICD-10-CM | POA: Diagnosis not present

## 2024-07-29 DIAGNOSIS — Z1231 Encounter for screening mammogram for malignant neoplasm of breast: Secondary | ICD-10-CM | POA: Diagnosis not present

## 2024-07-29 DIAGNOSIS — Z1151 Encounter for screening for human papillomavirus (HPV): Secondary | ICD-10-CM | POA: Diagnosis not present

## 2024-07-29 DIAGNOSIS — Z0000 Encounter for general adult medical examination without abnormal findings: Secondary | ICD-10-CM | POA: Diagnosis not present

## 2024-09-09 DIAGNOSIS — L659 Nonscarring hair loss, unspecified: Secondary | ICD-10-CM | POA: Diagnosis not present

## 2024-09-09 DIAGNOSIS — L400 Psoriasis vulgaris: Secondary | ICD-10-CM | POA: Diagnosis not present

## 2024-09-09 DIAGNOSIS — L2084 Intrinsic (allergic) eczema: Secondary | ICD-10-CM | POA: Diagnosis not present

## 2024-09-09 DIAGNOSIS — F900 Attention-deficit hyperactivity disorder, predominantly inattentive type: Secondary | ICD-10-CM | POA: Diagnosis not present

## 2024-09-09 DIAGNOSIS — Z6841 Body Mass Index (BMI) 40.0 and over, adult: Secondary | ICD-10-CM | POA: Diagnosis not present

## 2024-09-30 DIAGNOSIS — M48062 Spinal stenosis, lumbar region with neurogenic claudication: Secondary | ICD-10-CM | POA: Diagnosis not present

## 2024-10-28 DIAGNOSIS — R002 Palpitations: Secondary | ICD-10-CM | POA: Diagnosis not present

## 2024-10-28 DIAGNOSIS — G43919 Migraine, unspecified, intractable, without status migrainosus: Secondary | ICD-10-CM | POA: Diagnosis not present

## 2024-11-11 DIAGNOSIS — L400 Psoriasis vulgaris: Secondary | ICD-10-CM | POA: Diagnosis not present

## 2024-11-11 DIAGNOSIS — R002 Palpitations: Secondary | ICD-10-CM | POA: Diagnosis not present

## 2024-11-11 DIAGNOSIS — F900 Attention-deficit hyperactivity disorder, predominantly inattentive type: Secondary | ICD-10-CM | POA: Diagnosis not present

## 2024-11-11 DIAGNOSIS — N951 Menopausal and female climacteric states: Secondary | ICD-10-CM | POA: Diagnosis not present

## 2025-06-23 ENCOUNTER — Ambulatory Visit: Admitting: Neurology
